# Patient Record
Sex: Female | Born: 1950
Health system: Southern US, Community
[De-identification: ages and names within clinical notes are randomized; demographics above are authoritative.]

## PROBLEM LIST (undated history)

## (undated) DIAGNOSIS — R7303 Prediabetes: Secondary | ICD-10-CM

## (undated) DIAGNOSIS — Z9289 Personal history of other medical treatment: Secondary | ICD-10-CM

## (undated) DIAGNOSIS — E039 Hypothyroidism, unspecified: Secondary | ICD-10-CM

## (undated) DIAGNOSIS — H9190 Unspecified hearing loss, unspecified ear: Secondary | ICD-10-CM

## (undated) DIAGNOSIS — I739 Peripheral vascular disease, unspecified: Secondary | ICD-10-CM

## (undated) DIAGNOSIS — E785 Hyperlipidemia, unspecified: Secondary | ICD-10-CM

## (undated) DIAGNOSIS — I10 Essential (primary) hypertension: Secondary | ICD-10-CM

## (undated) HISTORY — DX: Hyperlipidemia, unspecified: E78.5

## (undated) HISTORY — PX: CATARACT EXTRACTION W/ INTRAOCULAR LENS IMPLANT: SHX1309

## (undated) HISTORY — PX: TONSILLECTOMY: SUR1361

## (undated) HISTORY — DX: Hypothyroidism, unspecified: E03.9

## (undated) HISTORY — DX: Personal history of other medical treatment: Z92.89

## (undated) HISTORY — DX: Unspecified hearing loss, unspecified ear: H91.90

---

## 1999-12-09 ENCOUNTER — Emergency Department (HOSPITAL_COMMUNITY): Admission: EM | Admit: 1999-12-09 | Discharge: 1999-12-09 | Payer: Self-pay | Admitting: Emergency Medicine

## 2000-01-22 ENCOUNTER — Ambulatory Visit (HOSPITAL_COMMUNITY): Admission: RE | Admit: 2000-01-22 | Discharge: 2000-01-22 | Payer: Self-pay | Admitting: Family Medicine

## 2000-02-12 ENCOUNTER — Ambulatory Visit (HOSPITAL_COMMUNITY): Admission: RE | Admit: 2000-02-12 | Discharge: 2000-02-12 | Payer: Self-pay | Admitting: Family Medicine

## 2000-02-12 ENCOUNTER — Encounter: Payer: Self-pay | Admitting: Family Medicine

## 2000-03-29 ENCOUNTER — Ambulatory Visit (HOSPITAL_COMMUNITY): Admission: RE | Admit: 2000-03-29 | Discharge: 2000-03-29 | Payer: Self-pay | Admitting: Gynecology

## 2000-03-29 ENCOUNTER — Encounter: Payer: Self-pay | Admitting: Gynecology

## 2003-05-21 ENCOUNTER — Emergency Department (HOSPITAL_COMMUNITY): Admission: EM | Admit: 2003-05-21 | Discharge: 2003-05-22 | Payer: Self-pay | Admitting: Emergency Medicine

## 2007-12-15 ENCOUNTER — Ambulatory Visit: Payer: Self-pay | Admitting: Gynecology

## 2007-12-15 ENCOUNTER — Other Ambulatory Visit: Admission: RE | Admit: 2007-12-15 | Discharge: 2007-12-15 | Payer: Self-pay | Admitting: Gynecology

## 2007-12-15 ENCOUNTER — Encounter: Payer: Self-pay | Admitting: Gynecology

## 2015-03-16 ENCOUNTER — Encounter: Payer: Self-pay | Admitting: *Deleted

## 2015-03-23 ENCOUNTER — Encounter: Payer: Self-pay | Admitting: Cardiology

## 2015-03-23 ENCOUNTER — Ambulatory Visit (INDEPENDENT_AMBULATORY_CARE_PROVIDER_SITE_OTHER): Payer: PPO | Admitting: Cardiology

## 2015-03-23 VITALS — BP 160/100 | HR 91 | Ht 60.0 in | Wt 132.1 lb

## 2015-03-23 DIAGNOSIS — E785 Hyperlipidemia, unspecified: Secondary | ICD-10-CM

## 2015-03-23 DIAGNOSIS — R9431 Abnormal electrocardiogram [ECG] [EKG]: Secondary | ICD-10-CM

## 2015-03-23 NOTE — Progress Notes (Signed)
Cardiology Office Note    Date:  03/23/2015   ID:  Karina [name], DOB Jan 14, 1951, MRN [id number]  PCP:  Ethel Rana  Cardiologist:   Donato Schultz, MD     History of Present Illness:  Karina [name] is a 65 y.o. female here for evaluation of her heart. She had a screening EKG one point and was told she had an old anterior myocardial infarction. Prior blood pressure was 120/80 at 02/22/15 office visit personally reviewed. Her LDL was 167, HDL 70.  Her EKG today shows sinus rhythm with negative deflection in V1 and V2 with prompt upright V3 through V6. Sometimes this is indicative of an old septal infarct pattern. She is currently taking pravastatin 20 mg.  Quit smoking 12/16  No CP, mild SOB. Under stress - had palpitations.     Past Medical History  Diagnosis Date  . Hearing loss     Past Surgical History  Procedure Laterality Date  . Tonsillectomy      Outpatient Prescriptions Prior to Visit  Medication Sig Dispense Refill  . cholecalciferol (VITAMIN D) 1000 units tablet Take 1,000 Units by mouth daily.     No facility-administered medications prior to visit.     Allergies:   Review of patient's allergies indicates no known allergies.   Social History   Social History  . Marital Status: Divorced    Spouse Name: N/A  . Number of Children: N/A  . Years of Education: N/A   Social History Main Topics  . Smoking status: Current Every Day Smoker  . Smokeless tobacco: None  . Alcohol Use: No  . Drug Use: No  . Sexual Activity: Not Asked   Other Topics Concern  . None   Social History Narrative     Family History:  The patient's family history includes Diabetes in her mother; Hypertension in her mother; Stroke in her mother.   ROS:   Please see the history of present illness.    ROS joint pain All other systems reviewed and are negative.   PHYSICAL EXAM:   VS:  BP 160/100 mmHg  Pulse 91  Ht 5' (1.524 m)  Wt 132 lb 1.9 oz (59.929 kg)  BMI  25.80 kg/m2  SpO2 97%   GEN: Well nourished, well developed, in no acute distress HEENT: normal Neck: no JVD, carotid bruits, or masses Cardiac: RRR; no murmurs, rubs, or gallops,no edema  Respiratory:  clear to auscultation bilaterally, normal work of breathing GI: soft, nontender, nondistended, + BS MS: no deformity or atrophy Skin: warm and dry, no rash Neuro:  Alert and Oriented x 3, Strength and sensation are intact Psych: euthymic mood, full affect  Wt Readings from Last 3 Encounters:  03/23/15 132 lb 1.9 oz (59.929 kg)      Studies/Labs Reviewed:   EKG:  EKG is ordered today.  The ekg ordered today demonstrates 03/22/68-sinus rhythm, 88, nonspecific T-wave changes, QT prolongation, QTC 484 ms.poor immediate R-wave progression V1 and V2, possible septal infarct. Personally viewed.no significant change from prior EKG  Recent Labs: No results found for requested labs within last 365 days.   Lipid Panel No results found for: CHOL, TRIG, HDL, CHOLHDL, VLDL, LDLCALC, LDLDIRECT  Additional studies/ records that were reviewed today include:  Glucose 111 LDL as above Office notes reviewed    ASSESSMENT:    1. Abnormal EKG   2. Hyperlipemia      PLAN:  In order of problems listed above:  1. Abnormal EKG-possible old  septal infarct pattern. I will check an echocardiogram to ensure that she has normal structure and function. Otherwise, nonspecific ST-T wave changes noted. No chest pain, no significant shortness of breath although she had had shortness of breath since stopping smoking. 2. Hyperlipidemia-continue with pravastatin     Medication Adjustments/Labs and Tests Ordered: Current medicines are reviewed at length with the patient today.  Concerns regarding medicines are outlined above.  Medication changes, Labs and Tests ordered today are listed in the Patient Instructions below. Patient Instructions  Medication Instructions:  The current medical regimen is  effective;  continue present plan and medications.  Testing/Procedures: Your physician has requested that you have an echocardiogram. Echocardiography is a painless test that uses sound waves to create images of your heart. It provides your doctor with information about the size and shape of your heart and how well your heart's chambers and valves are working. This procedure takes approximately one hour. There are no restrictions for this procedure.  Follow-Up: Follow up as needed after testing.  If you need a refill on your cardiac medications before your next appointment, please call your pharmacy.  Thank you for choosing [facility]!!            Signed, Donato Schultz, MD  03/23/2015 2:45 PM    [facility] [address] [place], [place], [place]  [address] Phone: [phone]; Fax: [phone]

## 2015-03-23 NOTE — Patient Instructions (Signed)
Medication Instructions:  The current medical regimen is effective;  continue present plan and medications.  Testing/Procedures: Your physician has requested that you have an echocardiogram. Echocardiography is a painless test that uses sound waves to create images of your heart. It provides your doctor with information about the size and shape of your heart and how well your heart's chambers and valves are working. This procedure takes approximately one hour. There are no restrictions for this procedure.  Follow-Up: Follow up as needed after testing.  If you need a refill on your cardiac medications before your next appointment, please call your pharmacy.  Thank you for choosing [facility]!!     

## 2015-03-25 ENCOUNTER — Other Ambulatory Visit: Payer: Self-pay

## 2015-03-25 DIAGNOSIS — Z1231 Encounter for screening mammogram for malignant neoplasm of breast: Secondary | ICD-10-CM

## 2015-03-25 DIAGNOSIS — E785 Hyperlipidemia, unspecified: Secondary | ICD-10-CM | POA: Diagnosis not present

## 2015-03-25 DIAGNOSIS — E069 Thyroiditis, unspecified: Secondary | ICD-10-CM | POA: Diagnosis not present

## 2015-04-04 ENCOUNTER — Ambulatory Visit (HOSPITAL_COMMUNITY): Payer: PPO | Attending: Internal Medicine

## 2015-04-04 ENCOUNTER — Telehealth (HOSPITAL_COMMUNITY): Payer: Self-pay | Admitting: *Deleted

## 2015-04-04 ENCOUNTER — Other Ambulatory Visit (HOSPITAL_COMMUNITY): Payer: PPO | Admitting: *Deleted

## 2015-04-04 ENCOUNTER — Other Ambulatory Visit: Payer: Self-pay

## 2015-04-04 DIAGNOSIS — Z87891 Personal history of nicotine dependence: Secondary | ICD-10-CM | POA: Insufficient documentation

## 2015-04-04 DIAGNOSIS — I5189 Other ill-defined heart diseases: Secondary | ICD-10-CM | POA: Diagnosis not present

## 2015-04-04 DIAGNOSIS — R9431 Abnormal electrocardiogram [ECG] [EKG]: Secondary | ICD-10-CM | POA: Insufficient documentation

## 2015-04-04 DIAGNOSIS — I071 Rheumatic tricuspid insufficiency: Secondary | ICD-10-CM | POA: Insufficient documentation

## 2015-04-04 MED ORDER — PERFLUTREN LIPID MICROSPHERE
1.0000 mL | INTRAVENOUS | Status: AC | PRN
  Administered 2015-04-04: 1 mL via INTRAVENOUS

## 2015-04-04 NOTE — Telephone Encounter (Signed)
Dr. Salome Holmes office notified of patient extreme high BP. Spoke with Sheri,nurse for Dr. Salome Holmes. She will notify the Dr.and get back with [place]. I have not discharged patient until I hear back from their office.

## 2015-04-04 NOTE — Telephone Encounter (Signed)
Sheri from Dr. Salome Holmes office called back. Dr. Salome Holmes wants [place] to discharge patient with no orders given. Sheri stated patient can stop by office tomorrow for recheck BP.

## 2015-04-05 ENCOUNTER — Ambulatory Visit
Admission: RE | Admit: 2015-04-05 | Discharge: 2015-04-05 | Disposition: A | Discharge status: Home / Self Care | Payer: PPO | Source: Ambulatory Visit

## 2015-04-05 DIAGNOSIS — Z1231 Encounter for screening mammogram for malignant neoplasm of breast: Secondary | ICD-10-CM | POA: Diagnosis not present

## 2015-05-10 DIAGNOSIS — I10 Essential (primary) hypertension: Secondary | ICD-10-CM | POA: Diagnosis not present

## 2015-05-30 DIAGNOSIS — E039 Hypothyroidism, unspecified: Secondary | ICD-10-CM | POA: Diagnosis not present

## 2015-05-30 DIAGNOSIS — I10 Essential (primary) hypertension: Secondary | ICD-10-CM | POA: Diagnosis not present

## 2015-06-18 ENCOUNTER — Encounter (HOSPITAL_COMMUNITY): Payer: Self-pay | Admitting: Emergency Medicine

## 2015-06-18 ENCOUNTER — Inpatient Hospital Stay (HOSPITAL_COMMUNITY)
Admission: EM | Admit: 2015-06-18 | Discharge: 2015-06-20 | DRG: 305 | Disposition: A | Discharge status: Home / Self Care | Payer: PPO | Attending: Internal Medicine | Admitting: Internal Medicine

## 2015-06-18 DIAGNOSIS — F17200 Nicotine dependence, unspecified, uncomplicated: Secondary | ICD-10-CM | POA: Diagnosis not present

## 2015-06-18 DIAGNOSIS — E039 Hypothyroidism, unspecified: Secondary | ICD-10-CM | POA: Diagnosis present

## 2015-06-18 DIAGNOSIS — H9190 Unspecified hearing loss, unspecified ear: Secondary | ICD-10-CM | POA: Diagnosis present

## 2015-06-18 DIAGNOSIS — Z823 Family history of stroke: Secondary | ICD-10-CM | POA: Diagnosis not present

## 2015-06-18 DIAGNOSIS — R778 Other specified abnormalities of plasma proteins: Secondary | ICD-10-CM | POA: Diagnosis present

## 2015-06-18 DIAGNOSIS — R0789 Other chest pain: Secondary | ICD-10-CM | POA: Diagnosis not present

## 2015-06-18 DIAGNOSIS — I10 Essential (primary) hypertension: Secondary | ICD-10-CM | POA: Diagnosis present

## 2015-06-18 DIAGNOSIS — R748 Abnormal levels of other serum enzymes: Secondary | ICD-10-CM | POA: Diagnosis present

## 2015-06-18 DIAGNOSIS — Z79899 Other long term (current) drug therapy: Secondary | ICD-10-CM

## 2015-06-18 DIAGNOSIS — I160 Hypertensive urgency: Secondary | ICD-10-CM | POA: Diagnosis not present

## 2015-06-18 DIAGNOSIS — F419 Anxiety disorder, unspecified: Secondary | ICD-10-CM | POA: Diagnosis not present

## 2015-06-18 DIAGNOSIS — Z833 Family history of diabetes mellitus: Secondary | ICD-10-CM | POA: Diagnosis not present

## 2015-06-18 DIAGNOSIS — R7989 Other specified abnormal findings of blood chemistry: Secondary | ICD-10-CM | POA: Diagnosis not present

## 2015-06-18 DIAGNOSIS — R079 Chest pain, unspecified: Secondary | ICD-10-CM

## 2015-06-18 DIAGNOSIS — E079 Disorder of thyroid, unspecified: Secondary | ICD-10-CM | POA: Diagnosis present

## 2015-06-18 DIAGNOSIS — I161 Hypertensive emergency: Principal | ICD-10-CM | POA: Diagnosis present

## 2015-06-18 DIAGNOSIS — Z8249 Family history of ischemic heart disease and other diseases of the circulatory system: Secondary | ICD-10-CM

## 2015-06-18 LAB — BASIC METABOLIC PANEL
Anion gap: 11 (ref 5–15)
BUN: 14 mg/dL (ref 6–20)
CALCIUM: 9.3 mg/dL (ref 8.9–10.3)
CO2: 22 mmol/L (ref 22–32)
Chloride: 99 mmol/L — ABNORMAL LOW (ref 101–111)
Creatinine, Ser: 0.78 mg/dL (ref 0.44–1.00)
GFR calc Af Amer: >60 mL/min (ref >60)
GLUCOSE: 117 mg/dL — AB (ref 65–99)
Potassium: 3.7 mmol/L (ref 3.5–5.1)
Sodium: 132 mmol/L — ABNORMAL LOW (ref 135–145)

## 2015-06-18 LAB — CBC WITH DIFFERENTIAL/PLATELET
Basophils Absolute: 0 10*3/uL (ref 0.0–0.1)
Basophils Relative: 0 %
EOS PCT: 0 %
Eosinophils Absolute: 0 10*3/uL (ref 0.0–0.7)
HCT: 36.8 % (ref 36.0–46.0)
Hemoglobin: 12.7 g/dL (ref 12.0–15.0)
LYMPHS ABS: 2.2 10*3/uL (ref 0.7–4.0)
LYMPHS PCT: 37 %
MCH: 30.4 pg (ref 26.0–34.0)
MCHC: 34.5 g/dL (ref 30.0–36.0)
MCV: 88 fL (ref 78.0–100.0)
MONO ABS: 0.4 10*3/uL (ref 0.1–1.0)
Monocytes Relative: 6 %
Neutro Abs: 3.4 10*3/uL (ref 1.7–7.7)
Neutrophils Relative %: 57 %
PLATELETS: 252 10*3/uL (ref 150–400)
RBC: 4.18 MIL/uL (ref 3.87–5.11)
RDW: 12.8 % (ref 11.5–15.5)
WBC: 6.1 10*3/uL (ref 4.0–10.5)

## 2015-06-18 LAB — TROPONIN I: Troponin I: 0.04 ng/mL — ABNORMAL HIGH (ref <0.031)

## 2015-06-18 NOTE — ED Notes (Signed)
Pt from home with complaints of hypertension. Pt has records of taking her blood pressure over 20 times every day since January. Pt states sometimes she has depression related to her thyroid problems. Pt is alert and oriented.

## 2015-06-18 NOTE — ED Notes (Signed)
MD at bedside. 

## 2015-06-19 ENCOUNTER — Inpatient Hospital Stay (HOSPITAL_COMMUNITY): Payer: PPO

## 2015-06-19 DIAGNOSIS — F17200 Nicotine dependence, unspecified, uncomplicated: Secondary | ICD-10-CM | POA: Diagnosis not present

## 2015-06-19 DIAGNOSIS — R748 Abnormal levels of other serum enzymes: Secondary | ICD-10-CM | POA: Diagnosis not present

## 2015-06-19 DIAGNOSIS — Z79899 Other long term (current) drug therapy: Secondary | ICD-10-CM | POA: Diagnosis not present

## 2015-06-19 DIAGNOSIS — E039 Hypothyroidism, unspecified: Secondary | ICD-10-CM | POA: Diagnosis not present

## 2015-06-19 DIAGNOSIS — I161 Hypertensive emergency: Secondary | ICD-10-CM | POA: Diagnosis not present

## 2015-06-19 DIAGNOSIS — F419 Anxiety disorder, unspecified: Secondary | ICD-10-CM | POA: Diagnosis not present

## 2015-06-19 DIAGNOSIS — I160 Hypertensive urgency: Secondary | ICD-10-CM | POA: Diagnosis not present

## 2015-06-19 DIAGNOSIS — I10 Essential (primary) hypertension: Secondary | ICD-10-CM

## 2015-06-19 DIAGNOSIS — R7989 Other specified abnormal findings of blood chemistry: Secondary | ICD-10-CM

## 2015-06-19 DIAGNOSIS — H9190 Unspecified hearing loss, unspecified ear: Secondary | ICD-10-CM | POA: Diagnosis not present

## 2015-06-19 DIAGNOSIS — R778 Other specified abnormalities of plasma proteins: Secondary | ICD-10-CM | POA: Diagnosis present

## 2015-06-19 DIAGNOSIS — E079 Disorder of thyroid, unspecified: Secondary | ICD-10-CM | POA: Diagnosis present

## 2015-06-19 DIAGNOSIS — R079 Chest pain, unspecified: Secondary | ICD-10-CM | POA: Diagnosis not present

## 2015-06-19 DIAGNOSIS — R0789 Other chest pain: Secondary | ICD-10-CM | POA: Insufficient documentation

## 2015-06-19 DIAGNOSIS — Z823 Family history of stroke: Secondary | ICD-10-CM | POA: Diagnosis not present

## 2015-06-19 DIAGNOSIS — Z8249 Family history of ischemic heart disease and other diseases of the circulatory system: Secondary | ICD-10-CM | POA: Diagnosis not present

## 2015-06-19 DIAGNOSIS — Z833 Family history of diabetes mellitus: Secondary | ICD-10-CM | POA: Diagnosis not present

## 2015-06-19 LAB — CBC
HCT: 34.3 % — ABNORMAL LOW (ref 36.0–46.0)
Hemoglobin: 11.8 g/dL — ABNORMAL LOW (ref 12.0–15.0)
MCH: 30.1 pg (ref 26.0–34.0)
MCHC: 34.4 g/dL (ref 30.0–36.0)
MCV: 87.5 fL (ref 78.0–100.0)
PLATELETS: 253 10*3/uL (ref 150–400)
RBC: 3.92 MIL/uL (ref 3.87–5.11)
RDW: 12.9 % (ref 11.5–15.5)
WBC: 4.9 10*3/uL (ref 4.0–10.5)

## 2015-06-19 LAB — BASIC METABOLIC PANEL
Anion gap: 10 (ref 5–15)
BUN: 12 mg/dL (ref 6–20)
CHLORIDE: 97 mmol/L — AB (ref 101–111)
CO2: 25 mmol/L (ref 22–32)
CREATININE: 0.77 mg/dL (ref 0.44–1.00)
Calcium: 9.3 mg/dL (ref 8.9–10.3)
Glucose, Bld: 97 mg/dL (ref 65–99)
POTASSIUM: 3.4 mmol/L — AB (ref 3.5–5.1)
SODIUM: 132 mmol/L — AB (ref 135–145)

## 2015-06-19 LAB — TROPONIN I
TROPONIN I: 0.04 ng/mL — AB (ref <0.031)
TROPONIN I: 0.03 ng/mL (ref <0.031)
TROPONIN I: 0.03 ng/mL (ref <0.031)

## 2015-06-19 LAB — TSH: TSH: 2.982 u[IU]/mL (ref 0.350–4.500)

## 2015-06-19 MED ORDER — SODIUM CHLORIDE 0.9% FLUSH: 3.0000 mL | INTRAVENOUS | Status: DC | PRN

## 2015-06-19 MED ORDER — CALCIUM CARBONATE 1250 (500 CA) MG PO TABS
1250.0000 mg | ORAL_TABLET | Freq: Every day | ORAL | Status: DC
Start: 2015-06-19 — End: 2015-06-20
  Administered 2015-06-19 – 2015-06-20 (×2): 1250 mg via ORAL
  Filled 2015-06-19 (×2): qty 1

## 2015-06-19 MED ORDER — TRIAMTERENE-HCTZ 37.5-25 MG PO TABS
1.0000 | ORAL_TABLET | Freq: Every day | ORAL | Status: DC
  Administered 2015-06-19 – 2015-06-20 (×2): 1 via ORAL
  Filled 2015-06-19 (×2): qty 1

## 2015-06-19 MED ORDER — ENOXAPARIN SODIUM 40 MG/0.4ML ~~LOC~~ SOLN
40.0000 mg | SUBCUTANEOUS | Status: DC
  Administered 2015-06-19 – 2015-06-20 (×2): 40 mg via SUBCUTANEOUS
  Filled 2015-06-19 (×2): qty 0.4

## 2015-06-19 MED ORDER — AMLODIPINE BESYLATE 5 MG PO TABS
5.0000 mg | ORAL_TABLET | Freq: Two times a day (BID) | ORAL | Status: DC
  Administered 2015-06-19 – 2015-06-20 (×2): 5 mg via ORAL
  Filled 2015-06-19 (×2): qty 1

## 2015-06-19 MED ORDER — HYDRALAZINE HCL 20 MG/ML IJ SOLN: 10.0000 mg | Freq: Four times a day (QID) | INTRAMUSCULAR | Status: DC | PRN

## 2015-06-19 MED ORDER — SODIUM CHLORIDE 0.9 % IV SOLN: 250.0000 mL | INTRAVENOUS | Status: DC | PRN

## 2015-06-19 MED ORDER — OXYCODONE HCL 5 MG PO TABS: 5.0000 mg | ORAL_TABLET | ORAL | Status: DC | PRN

## 2015-06-19 MED ORDER — LORAZEPAM 2 MG/ML IJ SOLN
0.5000 mg | Freq: Once | INTRAMUSCULAR | Status: AC
  Administered 2015-06-19: 0.5 mg via INTRAVENOUS
  Filled 2015-06-19: qty 1

## 2015-06-19 MED ORDER — NITROGLYCERIN 2 % TD OINT
1.0000 [in_us] | TOPICAL_OINTMENT | Freq: Three times a day (TID) | TRANSDERMAL | Status: DC
Start: 2015-06-19 — End: 2015-06-19

## 2015-06-19 MED ORDER — ACETAMINOPHEN 325 MG PO TABS: 650.0000 mg | ORAL_TABLET | Freq: Four times a day (QID) | ORAL | Status: DC | PRN

## 2015-06-19 MED ORDER — SODIUM CHLORIDE 0.9% FLUSH
3.0000 mL | Freq: Two times a day (BID) | INTRAVENOUS | Status: DC
  Administered 2015-06-19 – 2015-06-20 (×2): 3 mL via INTRAVENOUS

## 2015-06-19 MED ORDER — ENSURE ENLIVE PO LIQD
237.0000 mL | Freq: Two times a day (BID) | ORAL | Status: DC
  Administered 2015-06-19 (×2): 237 mL via ORAL

## 2015-06-19 MED ORDER — HYDROCHLOROTHIAZIDE 12.5 MG PO CAPS: 25.0000 mg | ORAL_CAPSULE | Freq: Every day | ORAL | Status: DC

## 2015-06-19 MED ORDER — ZOLPIDEM TARTRATE 5 MG PO TABS
5.0000 mg | ORAL_TABLET | Freq: Once | ORAL | Status: AC
  Administered 2015-06-19: 5 mg via ORAL
  Filled 2015-06-19: qty 1

## 2015-06-19 MED ORDER — ACETAMINOPHEN 650 MG RE SUPP: 650.0000 mg | Freq: Four times a day (QID) | RECTAL | Status: DC | PRN

## 2015-06-19 MED ORDER — HYDROCHLOROTHIAZIDE 25 MG PO TABS: 25.0000 mg | ORAL_TABLET | Freq: Every day | ORAL | Status: DC

## 2015-06-19 MED ORDER — LEVOTHYROXINE SODIUM 50 MCG PO TABS
75.0000 ug | ORAL_TABLET | Freq: Every day | ORAL | Status: DC
  Administered 2015-06-19 – 2015-06-20 (×2): 75 ug via ORAL
  Filled 2015-06-19 (×2): qty 1

## 2015-06-19 MED ORDER — POTASSIUM CHLORIDE CRYS ER 20 MEQ PO TBCR
40.0000 meq | EXTENDED_RELEASE_TABLET | Freq: Two times a day (BID) | ORAL | Status: AC
  Administered 2015-06-19 (×2): 40 meq via ORAL
  Filled 2015-06-19 (×2): qty 2

## 2015-06-19 MED ORDER — SODIUM CHLORIDE 0.9% FLUSH
3.0000 mL | Freq: Two times a day (BID) | INTRAVENOUS | Status: DC
  Administered 2015-06-19 (×2): 3 mL via INTRAVENOUS

## 2015-06-19 MED ORDER — NITROGLYCERIN 2 % TD OINT
1.0000 [in_us] | TOPICAL_OINTMENT | Freq: Four times a day (QID) | TRANSDERMAL | Status: DC
  Administered 2015-06-19 (×2): 1 [in_us] via TOPICAL
  Filled 2015-06-19: qty 30
  Filled 2015-06-19: qty 1

## 2015-06-19 MED ORDER — ONDANSETRON HCL 4 MG PO TABS: 4.0000 mg | ORAL_TABLET | Freq: Four times a day (QID) | ORAL | Status: DC | PRN

## 2015-06-19 MED ORDER — HYDROCHLOROTHIAZIDE 12.5 MG PO CAPS
12.5000 mg | ORAL_CAPSULE | Freq: Once | ORAL | Status: AC
  Administered 2015-06-19: 12.5 mg via ORAL
  Filled 2015-06-19: qty 1

## 2015-06-19 MED ORDER — HYDROCHLOROTHIAZIDE 12.5 MG PO CAPS
12.5000 mg | ORAL_CAPSULE | Freq: Every day | ORAL | Status: DC
  Administered 2015-06-19: 12.5 mg via ORAL
  Filled 2015-06-19: qty 1

## 2015-06-19 MED ORDER — PRAVASTATIN SODIUM 20 MG PO TABS
20.0000 mg | ORAL_TABLET | Freq: Every day | ORAL | Status: DC
  Administered 2015-06-19 – 2015-06-20 (×2): 20 mg via ORAL
  Filled 2015-06-19 (×2): qty 1

## 2015-06-19 MED ORDER — PANTOPRAZOLE SODIUM 40 MG PO TBEC
40.0000 mg | DELAYED_RELEASE_TABLET | Freq: Every day | ORAL | Status: DC
  Administered 2015-06-19 – 2015-06-20 (×2): 40 mg via ORAL
  Filled 2015-06-19 (×3): qty 1

## 2015-06-19 MED ORDER — AMLODIPINE BESYLATE 5 MG PO TABS
5.0000 mg | ORAL_TABLET | Freq: Every day | ORAL | Status: DC
  Administered 2015-06-19: 5 mg via ORAL
  Filled 2015-06-19: qty 1

## 2015-06-19 MED ORDER — ASPIRIN 325 MG PO TABS
325.0000 mg | ORAL_TABLET | Freq: Every day | ORAL | Status: DC
  Administered 2015-06-19 – 2015-06-20 (×2): 325 mg via ORAL
  Filled 2015-06-19 (×3): qty 1

## 2015-06-19 MED ORDER — ONDANSETRON HCL 4 MG/2ML IJ SOLN: 4.0000 mg | Freq: Four times a day (QID) | INTRAMUSCULAR | Status: DC | PRN

## 2015-06-19 MED ORDER — HYDROMORPHONE HCL 1 MG/ML IJ SOLN: 0.5000 mg | INTRAMUSCULAR | Status: DC | PRN

## 2015-06-19 MED ORDER — LISINOPRIL 10 MG PO TABS
10.0000 mg | ORAL_TABLET | Freq: Once | ORAL | Status: AC
  Administered 2015-06-19: 10 mg via ORAL
  Filled 2015-06-19: qty 1

## 2015-06-19 NOTE — Progress Notes (Signed)
[facility] PROGRESS NOTE  Karina MORNEAULT KGM:[id number] DOB: May 09, 1950 DOA: 06/18/2015 PCP: Beatris Si  Assessment/Plan: 1-HTN emergency; Patient presents with chest discomfort, elevated BP.  Will increase HCTZ,, repeat B-met in am to follow sodium level. Added KCL. Discontinue nitropaste... Added Norvasc.  PRN hydralazine.   2-Chest discomfort, Patient report " bad feeling, something wrong in her chest.  Check chest x ray.  Mildly elevated troponin might be related to HTN  Cardiology consulted.   3-Hypothyroid;  Continue with synthroid.  Repeated TSH; normal.   Code Status: full code.  Family Communication: care discussed with daughter and son who were at bedside.  Disposition Plan: home when BP better controlled.    Consultants:  Cardiology   Procedures:  none  Antibiotics:  none  HPI/Subjective: Lisinopril was stop because she was having problems with tasting food.  She report bad feeling, chest pressure ? , something is wrong with her chest   Objective: Filed Vitals:   06/19/15 0300 06/19/15 0350  BP: 179/67 175/57  Pulse: 71 62  Temp:  98.7 F (37.1 C)  Resp: 16 16    Intake/Output Summary (Last 24 hours) at 06/19/15 1109 Last data filed at 06/19/15 0900  Gross per 24 hour  Intake     60 ml  Output      0 ml  Net     60 ml   Filed Weights   06/18/15 2017 06/19/15 0350  Weight: 58.06 kg (128 lb) 53.4 kg (117 lb 11.6 oz)    Exam:   General:  Alert in no acute distress  Cardiovascular: S 1, S 2 RRR  Respiratory: CTA  Abdomen: BS present, soft, nt  Musculoskeletal: no edema  Data Reviewed: Basic Metabolic Panel:  Recent Labs Lab 06/18/15 2303 06/19/15 0525  NA 132* 132*  K 3.7 3.4*  CL 99* 97*  CO2 22 25  GLUCOSE 117* 97  BUN 14 12  CREATININE 0.78 0.77  CALCIUM 9.3 9.3   Liver Function Tests: No results for input(s): AST, ALT, ALKPHOS, BILITOT, PROT, ALBUMIN in the last 168 hours. No results for input(s):  LIPASE, AMYLASE in the last 168 hours. No results for input(s): AMMONIA in the last 168 hours. CBC:  Recent Labs Lab 06/18/15 2303 06/19/15 0525  WBC 6.1 4.9  NEUTROABS 3.4  --   HGB 12.7 11.8*  HCT 36.8 34.3*  MCV 88.0 87.5  PLT 252 253   Cardiac Enzymes:  Recent Labs Lab 06/18/15 2303 06/19/15 0525  TROPONINI 0.04* 0.04*   BNP (last 3 results) No results for input(s): BNP in the last 8760 hours.  ProBNP (last 3 results) No results for input(s): PROBNP in the last 8760 hours.  CBG: No results for input(s): GLUCAP in the last 168 hours.  No results found for this or any previous visit (from the past 240 hour(s)).   Studies: No results found.  Scheduled Meds: . aspirin  325 mg Oral Daily  . calcium carbonate  1,250 mg Oral Daily  . enoxaparin (LOVENOX) injection  40 mg Subcutaneous Q24H  . feeding supplement (ENSURE ENLIVE)  237 mL Oral BID BM  . [START ON 06/20/2015] hydrochlorothiazide  25 mg Oral Daily  . hydrochlorothiazide  12.5 mg Oral Once  . levothyroxine  75 mcg Oral QAC breakfast  . nitroGLYCERIN  1 inch Topical 3 times per day  . pantoprazole  40 mg Oral Daily  . potassium chloride  40 mEq Oral BID  . pravastatin  20 mg Oral q1800  .  sodium chloride flush  3 mL Intravenous Q12H  . sodium chloride flush  3 mL Intravenous Q12H   Continuous Infusions:   Principal Problem:   Hypertensive urgency Active Problems:   Thyroid disease   Elevated troponin   Anxiety   Chest pain    Time spent: 35 minutes.     Niel Hummer A  [facility] Pager [phone]. If 7PM-7AM, please contact night-coverage at [email or web address], password [email or web address] 06/19/2015, 11:09 AM  LOS: 0 days

## 2015-06-19 NOTE — Consult Note (Signed)
Primary Physician: Primary Cardiologist:  New    Asked to see re CP and elevated BP  HPI: Pt is a 65 yow with hsitory of HTN, hypothyroidism.  Admitted yesratdy with increased BP  Pt says like that since meds changed   Patient says taht she feels like "scared  ' semsatopm in chest   Feeling  Bad Takes BP at home        Past Medical History  Diagnosis Date  . Hearing loss   . Thyroid disease     "thyroid problem" unable to explain    Medications Prior to Admission  Medication Sig Dispense Refill  . calcium carbonate (OSCAL) 1500 (600 Ca) MG TABS tablet Take 600 mg of elemental calcium by mouth daily.    . hydrochlorothiazide (HYDRODIURIL) 12.5 MG tablet Take 12.5 mg by mouth daily.  2  . levothyroxine (SYNTHROID, LEVOTHROID) 75 MCG tablet Take 75 mcg by mouth daily.  0  . pravastatin (PRAVACHOL) 20 MG tablet Take 20 mg by mouth daily. Reported on 06/18/2015  1     . amLODipine  5 mg Oral Daily  . aspirin  325 mg Oral Daily  . calcium carbonate  1,250 mg Oral Daily  . enoxaparin (LOVENOX) injection  40 mg Subcutaneous Q24H  . feeding supplement (ENSURE ENLIVE)  237 mL Oral BID BM  . [START ON 06/20/2015] hydrochlorothiazide  25 mg Oral Daily  . levothyroxine  75 mcg Oral QAC breakfast  . pantoprazole  40 mg Oral Daily  . potassium chloride  40 mEq Oral BID  . pravastatin  20 mg Oral q1800  . sodium chloride flush  3 mL Intravenous Q12H  . sodium chloride flush  3 mL Intravenous Q12H    Infusions:    No Known Allergies  Social History   Social History  . Marital Status: Divorced    Spouse Name: N/A  . Number of Children: N/A  . Years of Education: N/A   Occupational History  . Not on file.   Social History Main Topics  . Smoking status: Current Every Day Smoker  . Smokeless tobacco: Not on file  . Alcohol Use: No  . Drug Use: No  . Sexual Activity: Not on file   Other Topics Concern  . Not on file   Social History Narrative    Family History    Problem Relation Age of Onset  . Stroke Mother   . Diabetes Mother   . Hypertension Mother     REVIEW OF SYSTEMS:  All systems reviewed  Negative to the above problem except as noted above.    PHYSICAL EXAM: Filed Vitals:   06/19/15 0350 06/19/15 1125  BP: 175/57 172/63  Pulse: 62   Temp: 98.7 F (37.1 C)   Resp: 16      Intake/Output Summary (Last 24 hours) at 06/19/15 1334 Last data filed at 06/19/15 0900  Gross per 24 hour  Intake     60 ml  Output      0 ml  Net     60 ml    General:  Well appearing. No respiratory difficulty HEENT: normal Neck: supple. no JVD. Carotids 2+ bilat; no bruits. No lymphadenopathy or thryomegaly appreciated. Cor: PMI nondisplaced. Regular rate & rhythm. No rubs, gallops or murmurs. Lungs: clear Abdomen: soft, nontender, nondistended. No hepatosplenomegaly. No bruits or masses. Good bowel sounds. Extremities: no cyanosis, clubbing, rash, edema Neuro: alert & oriented x 3, cranial nerves grossly intact. moves all 4 extremities  w/o difficulty. Affect pleasant.  ECG:  SR 83 bpm  Sl ST depression in the inferior and lateral leads.    Results for orders placed or performed during the hospital encounter of 06/18/15 (from the past 24 hour(s))  CBC with Differential/Platelet     Status: None   Collection Time: 06/18/15 11:03 PM  Result Value Ref Range   WBC 6.1 4.0 - 10.5 K/uL   RBC 4.18 3.87 - 5.11 MIL/uL   Hemoglobin 12.7 12.0 - 15.0 g/dL   HCT 53.6 64.4 - 03.4 %   MCV 88.0 78.0 - 100.0 fL   MCH 30.4 26.0 - 34.0 pg   MCHC 34.5 30.0 - 36.0 g/dL   RDW 74.2 59.5 - 63.8 %   Platelets 252 150 - 400 K/uL   Neutrophils Relative % 57 %   Neutro Abs 3.4 1.7 - 7.7 K/uL   Lymphocytes Relative 37 %   Lymphs Abs 2.2 0.7 - 4.0 K/uL   Monocytes Relative 6 %   Monocytes Absolute 0.4 0.1 - 1.0 K/uL   Eosinophils Relative 0 %   Eosinophils Absolute 0.0 0.0 - 0.7 K/uL   Basophils Relative 0 %   Basophils Absolute 0.0 0.0 - 0.1 K/uL  Basic  metabolic panel     Status: Abnormal   Collection Time: 06/18/15 11:03 PM  Result Value Ref Range   Sodium 132 (L) 135 - 145 mmol/L   Potassium 3.7 3.5 - 5.1 mmol/L   Chloride 99 (L) 101 - 111 mmol/L   CO2 22 22 - 32 mmol/L   Glucose, Bld 117 (H) 65 - 99 mg/dL   BUN 14 6 - 20 mg/dL   Creatinine, Ser 7.56 0.44 - 1.00 mg/dL   Calcium 9.3 8.9 - 43.3 mg/dL   GFR calc non Af Amer >60 >60 mL/min   GFR calc Af Amer >60 >60 mL/min   Anion gap 11 5 - 15  Troponin I     Status: Abnormal   Collection Time: 06/18/15 11:03 PM  Result Value Ref Range   Troponin I 0.04 (H) <0.031 ng/mL  Basic metabolic panel     Status: Abnormal   Collection Time: 06/19/15  5:25 AM  Result Value Ref Range   Sodium 132 (L) 135 - 145 mmol/L   Potassium 3.4 (L) 3.5 - 5.1 mmol/L   Chloride 97 (L) 101 - 111 mmol/L   CO2 25 22 - 32 mmol/L   Glucose, Bld 97 65 - 99 mg/dL   BUN 12 6 - 20 mg/dL   Creatinine, Ser 2.95 0.44 - 1.00 mg/dL   Calcium 9.3 8.9 - 18.8 mg/dL   GFR calc non Af Amer >60 >60 mL/min   GFR calc Af Amer >60 >60 mL/min   Anion gap 10 5 - 15  CBC     Status: Abnormal   Collection Time: 06/19/15  5:25 AM  Result Value Ref Range   WBC 4.9 4.0 - 10.5 K/uL   RBC 3.92 3.87 - 5.11 MIL/uL   Hemoglobin 11.8 (L) 12.0 - 15.0 g/dL   HCT 41.6 (L) 60.6 - 30.1 %   MCV 87.5 78.0 - 100.0 fL   MCH 30.1 26.0 - 34.0 pg   MCHC 34.4 30.0 - 36.0 g/dL   RDW 60.1 09.3 - 23.5 %   Platelets 253 150 - 400 K/uL  TSH     Status: None   Collection Time: 06/19/15  5:25 AM  Result Value Ref Range   TSH 2.982 0.350 - 4.500  uIU/mL  Troponin I     Status: Abnormal   Collection Time: 06/19/15  5:25 AM  Result Value Ref Range   Troponin I 0.04 (H) <0.031 ng/mL  Troponin I     Status: None   Collection Time: 06/19/15 11:53 AM  Result Value Ref Range   Troponin I 0.03 <0.031 ng/mL   Dg Chest 2 View  06/19/2015  CLINICAL DATA:  65 year old female with high blood pressure. EXAM: CHEST  2 VIEW COMPARISON:  No priors.  FINDINGS: Lung volumes are normal. No consolidative airspace disease. No pleural effusions. No pneumothorax. No pulmonary nodule or mass noted. Pulmonary vasculature and the cardiomediastinal silhouette are within normal limits. Atherosclerosis in the thoracic aorta. IMPRESSION: 1.  No radiographic evidence of acute cardiopulmonary disease. 2. Atherosclerosis. Electronically Signed   By: Trudie Reedaniel  Entrikin M.D.   On: 06/19/2015 11:54     ASSESSMENT: Pt is a 65 yo with history of HTN  Admitted with yesterday  BP severely elevated at time and pt had " funny feeling in chest"  Nervous?   Since admit BP has improvedsome  Feeling in chest is gone PT and daughter say that her BP is high at home She has attrib to being nervous   Was on lisinopril but stopped because ? Loss of taste  OVerall, history is a little confusing   On exam, BP is still not well controlled  Labs signfi for trivial elevation of troponin Probably due to BP elevation CXR shows some calcifications of the aorta  Recomm  1  Given chest sensations and presence of vascular dz on Xray I would recomm lexiscan myovue to r/o ischemia  2  HTN  BP still not controlled  I would increase amlodipine to 5 bid Watch KCL now that on 25 of hydrodiuril  Would recomm switching to Maxzide On lisinopril  ? Taste issue.  Consider switch to Cozaar  3  HL  Continue statin

## 2015-06-19 NOTE — H&P (Signed)
[facility] Admission History and Physical       Karina [name] AVW:[id number] DOB: 06/17/1950 DOA: 06/18/2015  Referring physician: EDP PCP: Ethel Rana  Specialists:   Chief Complaint: Elevated Blood Pressures  HPI: Karina [name] is a 65 y.o. female with a history of HTN, Hypothyroid who presents to the ED with complaints of elevated blood pressures at home worse the past day.    She reports her blood pressures have been elevated since her blood pressures medications had been changed and her Lisinopril had been discontinued in March.   She has been hypervigilant with checking of her blood pressures She reports having headaches, and also reports that her chest does not feel good.   She was administered Lisinopril  PO x 1 in the ED, and was evaluated and was found to have a positive Troponin level at 0.04 and was referred for observation.     Review of Systems:    Constitutional: No Weight Loss, No Weight Gain, Night Sweats, Fevers, Chills, Dizziness, Light Headedness, Fatigue, or Generalized Weakness HEENT:  +Headaches, Difficulty Swallowing,Tooth/Dental Problems,Sore Throat,  No Sneezing, Rhinitis, Ear Ache, Nasal Congestion, or Post Nasal Drip,  Cardio-vascular:  +Chest pain, Orthopnea, PND, Edema in Lower Extremities, Anasarca, Dizziness, Palpitations  Resp: No Dyspnea, No DOE, No Productive Cough, No Non-Productive Cough, No Hemoptysis, No Wheezing.    GI: No Heartburn, Indigestion, Abdominal Pain, Nausea, Vomiting, Diarrhea, Constipation, Hematemesis, Hematochezia, Melena, Change in Bowel Habits,  Loss of Appetite  GU: No Dysuria, No Change in Color of Urine, No Urgency or Urinary Frequency, No Flank pain.  Musculoskeletal: No Joint Pain or Swelling, No Decreased Range of Motion, No Back Pain.  Neurologic: No Syncope, No Seizures, Muscle Weakness, Paresthesia, Vision Disturbance or Loss, No Diplopia, No Vertigo, No Difficulty Walking,  Skin: No Rash or Lesions. Psych:  No Change in Mood or Affect, No Depression or Anxiety, No Memory loss, No Confusion, or Hallucinations   Past Medical History  Diagnosis Date  . Hearing loss   . Thyroid disease     "thyroid problem" unable to explain     Past Surgical History  Procedure Laterality Date  . Tonsillectomy        Prior to Admission medications   Medication Sig Start Date End Date Taking? Authorizing Provider  calcium carbonate (OSCAL) 1500 (600 Ca) MG TABS tablet Take 600 mg of elemental calcium by mouth daily.   Yes Historical Provider, MD  hydrochlorothiazide (HYDRODIURIL) 12.5 MG tablet Take 12.5 mg by mouth daily. 06/01/15  Yes Historical Provider, MD  levothyroxine (SYNTHROID, LEVOTHROID) 75 MCG tablet Take 75 mcg by mouth daily. 06/01/15  Yes Historical Provider, MD  pravastatin (PRAVACHOL) 20 MG tablet Take 20 mg by mouth daily. Reported on 06/18/2015 02/25/15  Yes Historical Provider, MD     No Known Allergies     Social History:  reports that she has been smoking.  She does not have any smokeless tobacco history on file. She reports that she does not drink alcohol or use illicit drugs.    Family History  Problem Relation Age of Onset  . Stroke Mother   . Diabetes Mother   . Hypertension Mother        Physical Exam:  GEN:  Pleasant and Anxious Well Nourished and Well Developed 65 y.o. Asian female examined and in no acute distress; cooperative with exam Filed Vitals:   06/18/15 2019 06/18/15 2157 06/18/15 2334 06/19/15 0044  BP: 222/86 207/62 119/65 195/66  Pulse:  75 74 74  Temp:      TempSrc:      Resp:  Height:      Weight:      SpO2:  98% 96% 98%   Blood pressure 195/66, pulse 74, temperature 98.3 F (36.8 C), temperature source Oral, resp. rate 15, height 5' (1.524 m), weight 58.06 kg (128 lb), SpO2 98 %. PSYCH: She is alert and oriented x4; does not appear anxious does not appear depressed; affect is normal HEENT: Normocephalic and Atraumatic, Mucous membranes  pink; PERRLA; EOM intact; Fundi:  Benign;  No scleral icterus, Nares: Patent, Oropharynx: Clear, Fair Dentition,    Neck:  FROM, No Cervical Lymphadenopathy nor Thyromegaly or Carotid Bruit; No JVD; Breasts:: Not examined CHEST WALL: No tenderness CHEST: Normal respiration, clear to auscultation bilaterally HEART: Regular rate and rhythm; no murmurs rubs or ABDOMEN:   +Bowel Sounds, Soft Non-Tender, No Rebound or Guarding; No Masses, No Organomegaly. Rectal Exam: Not done EXTREMITIES: No Cyanosis, Clubbing, or Edema; No Ulcerations. Genitalia: not examined PULSES: 2+ and symmetric SKIN: Normal hydration no rash or ulceration CNS:  Alert and Oriented x 4, No Focal Deficits Vascular: pulses palpable throughout    Labs on Admission:  Basic Metabolic Panel:  Recent Labs Lab 06/18/15 2303  NA 132*  K 3.7  CL 99*  CO2 22  GLUCOSE 117*  BUN 14  CREATININE 0.78  CALCIUM 9.3   Liver Function Tests: No results for input(s): AST, ALT, ALKPHOS, BILITOT, PROT, ALBUMIN in the last 168 hours. No results for input(s): LIPASE, AMYLASE in the last 168 hours. No results for input(s): AMMONIA in the last 168 hours. CBC:  Recent Labs Lab 06/18/15 2303  WBC 6.1  NEUTROABS 3.4  HGB 12.7  HCT 36.8  MCV 88.0  PLT 252   Cardiac Enzymes:  Recent Labs Lab 06/18/15 2303  TROPONINI 0.04*    BNP (last 3 results) No results for input(s): BNP in the last 8760 hours.  ProBNP (last 3 results) No results for input(s): PROBNP in the last 8760 hours.  CBG: No results for input(s): GLUCAP in the last 168 hours.  Radiological Exams on Admission: No results found.   EKG: Independently reviewed. Normal Sinus Rhythm rate = 84    Assessment/Plan:       65 y.o. female with  Principal Problem:    Hypertensive urgency   IV Hydralazine PRN   Monitor BPs   Active Problems:    Elevated troponin   Cardiac Monitoring   Cycle Troponins   Nitropaste, O2, ASA     Chest pain   Cardiac  Monitoring   Cycle Troponins      Thyroid disease- Hx of RAI with resultant Hypothyroid, now on replacement   Continue Levothyroxine Rx   Check TSH      Anxiety   IV Ativan x 1    DVT Prophylaxis   Lovenox     Code Status:     FULL CODE       Family Communication:   Family Friend at Bedside     Disposition Plan:   Observation Status        Time spent:  54 Minutes      Ron Parker [facility] Pager [phone]   If 7AM -7PM Please Contact the Day Rounding Team MD for [facility]  If 7PM-7AM, Please Contact Night-Floor Coverage  [email or web address] Password [email or web address] 06/19/2015, 1:00 AM     ADDENDUM:   Patient was seen and examined  on 06/19/2015

## 2015-06-19 NOTE — ED Provider Notes (Signed)
CSN: [id number]     Arrival date & time 06/18/15  1955 History   First MD Initiated Contact with Patient 06/18/15 2130     Chief Complaint  Patient presents with  . Hypertension     (Consider location/radiation/quality/duration/timing/severity/associated sxs/prior Treatment) HPI Comments: Pt comes in with cc of elevated BP. Pt has hx of HTN. Reports that in March her PCP changed her medication for BP. She is unsure why. It seems like pt was on lisinopril 10 - HCTZ 12.5, and she was asked to just take HCTZ 12.5 mg. She reports that she had seen her a Cardiologist in Jan for abnormal EKG, and they had recommended her to get a good BP monitor and check BP occasionally, so she has been doing that lately and has noted several elevated BP readings. Pt has some chest discomfort, that she describes as feeling "anxious" or "worried" type discomfort. She has no dib, headaches, numbness, tingling, dizziness.  BP here is 225/90 when i was in the room.   ROS 10 Systems reviewed and are negative for acute change except as noted in the HPI.      Patient is a 65 y.o. female presenting with hypertension. The history is provided by the patient.  Hypertension    Past Medical History  Diagnosis Date  . Hearing loss   . Thyroid disease     "thyroid problem" unable to explain   Past Surgical History  Procedure Laterality Date  . Tonsillectomy     Family History  Problem Relation Age of Onset  . Stroke Mother   . Diabetes Mother   . Hypertension Mother    Social History  Substance Use Topics  . Smoking status: Current Every Day Smoker  . Smokeless tobacco: None  . Alcohol Use: No   OB History    No data available     Review of Systems    Allergies  Review of patient's allergies indicates no known allergies.  Home Medications   Prior to Admission medications   Medication Sig Start Date End Date Taking? Authorizing Provider  calcium carbonate (OSCAL) 1500 (600 Ca) MG TABS  tablet Take 600 mg of elemental calcium by mouth daily.   Yes Historical Provider, MD  hydrochlorothiazide (HYDRODIURIL) 12.5 MG tablet Take 12.5 mg by mouth daily. 06/01/15  Yes Historical Provider, MD  levothyroxine (SYNTHROID, LEVOTHROID) 75 MCG tablet Take 75 mcg by mouth daily. 06/01/15  Yes Historical Provider, MD  pravastatin (PRAVACHOL) 20 MG tablet Take 20 mg by mouth daily. Reported on 06/18/2015 02/25/15  Yes Historical Provider, MD   BP 195/66 mmHg  Pulse 74  Temp(Src) 98.3 F (36.8 C) (Oral)  Resp 15  Ht 5' (1.524 m)  Wt 128 lb (58.06 kg)  BMI 25.00 kg/m2  SpO2 98% Physical Exam  Constitutional: She is oriented to person, place, and time. She appears well-developed.  HENT:  Head: Normocephalic and atraumatic.  Eyes: EOM are normal.  Neck: Normal range of motion. Neck supple.  Cardiovascular: Normal rate.   No murmur heard. Pulmonary/Chest: Effort normal.  Abdominal: Bowel sounds are normal.  Neurological: She is alert and oriented to person, place, and time. No cranial nerve deficit. Coordination normal.  Skin: Skin is warm and dry.  Nursing note and vitals reviewed.   ED Course  Procedures (including critical care time) Labs Review Labs Reviewed  BASIC METABOLIC PANEL - Abnormal; Notable for the following:    Sodium 132 (*)    Chloride 99 (*)    Glucose,  Bld 117 (*)    All other components within normal limits  TROPONIN I - Abnormal; Notable for the following:    Troponin I 0.04 (*)    All other components within normal limits  CBC WITH DIFFERENTIAL/PLATELET    Imaging Review No results found. I have personally reviewed and evaluated these images and lab results as part of my medical decision-making.   EKG Interpretation   Date/Time:  Saturday June 18 2015 23:06:05 EDT Ventricular Rate:  84 PR Interval:  141 QRS Duration: 96 QT Interval:  397 QTC Calculation: 469 R Axis:   60 Text Interpretation:  Sinus rhythm Left atrial enlargement Inferior   infarct, age indeterminate Lateral leads are also involved [id number] pattern   Nonspecific ST and T wave abnormality Confirmed by Rhunette Croft, MD, Janey Genta  [phone]) on 06/18/2015 11:41:24 PM      MDM   Final diagnoses:  Elevated troponin  Hypertensive emergency    Pt comes in with elevated BP. She has had recent med change - and that is likely the cause. She has non acute EKG changes that do suggest some abnormalities, and atypical chest discomfort - so with the pressure elevation we did get trop level which is at the higher end of normal. We will admit for BP optimization.     Derwood Kaplan, MD 06/19/15 [phone]

## 2015-06-19 NOTE — ED Notes (Signed)
Per Dr.Nanavati administer 10mg  Lisinopril PO. MD also aware of labs.

## 2015-06-19 NOTE — ED Notes (Signed)
MD at bedside. 

## 2015-06-20 ENCOUNTER — Ambulatory Visit (HOSPITAL_COMMUNITY)
Admit: 2015-06-20 | Discharge: 2015-06-20 | Disposition: A | Discharge status: Home / Self Care | Payer: PPO | Attending: Internal Medicine | Admitting: Internal Medicine

## 2015-06-20 DIAGNOSIS — R079 Chest pain, unspecified: Secondary | ICD-10-CM | POA: Diagnosis not present

## 2015-06-20 DIAGNOSIS — T502X5A Adverse effect of carbonic-anhydrase inhibitors, benzothiadiazides and other diuretics, initial encounter: Secondary | ICD-10-CM | POA: Diagnosis not present

## 2015-06-20 DIAGNOSIS — I160 Hypertensive urgency: Secondary | ICD-10-CM | POA: Diagnosis not present

## 2015-06-20 DIAGNOSIS — H9190 Unspecified hearing loss, unspecified ear: Secondary | ICD-10-CM | POA: Diagnosis not present

## 2015-06-20 DIAGNOSIS — E878 Other disorders of electrolyte and fluid balance, not elsewhere classified: Secondary | ICD-10-CM | POA: Diagnosis not present

## 2015-06-20 DIAGNOSIS — R7989 Other specified abnormal findings of blood chemistry: Secondary | ICD-10-CM | POA: Diagnosis not present

## 2015-06-20 DIAGNOSIS — Z79899 Other long term (current) drug therapy: Secondary | ICD-10-CM | POA: Diagnosis not present

## 2015-06-20 DIAGNOSIS — R0789 Other chest pain: Secondary | ICD-10-CM | POA: Diagnosis not present

## 2015-06-20 DIAGNOSIS — Z7982 Long term (current) use of aspirin: Secondary | ICD-10-CM | POA: Diagnosis not present

## 2015-06-20 DIAGNOSIS — E871 Hypo-osmolality and hyponatremia: Secondary | ICD-10-CM | POA: Diagnosis not present

## 2015-06-20 DIAGNOSIS — I10 Essential (primary) hypertension: Secondary | ICD-10-CM | POA: Diagnosis not present

## 2015-06-20 DIAGNOSIS — E860 Dehydration: Secondary | ICD-10-CM | POA: Diagnosis not present

## 2015-06-20 DIAGNOSIS — F411 Generalized anxiety disorder: Secondary | ICD-10-CM | POA: Diagnosis not present

## 2015-06-20 DIAGNOSIS — E785 Hyperlipidemia, unspecified: Secondary | ICD-10-CM | POA: Diagnosis not present

## 2015-06-20 LAB — NM MYOCAR MULTI W/SPECT W/WALL MOTION / EF
CHL CUP RESTING HR STRESS: 74 {beats}/min
CSEPPHR: 108 {beats}/min

## 2015-06-20 LAB — BASIC METABOLIC PANEL
ANION GAP: 9 (ref 5–15)
BUN: 23 mg/dL — ABNORMAL HIGH (ref 6–20)
CALCIUM: 9.1 mg/dL (ref 8.9–10.3)
CO2: 22 mmol/L (ref 22–32)
Chloride: 99 mmol/L — ABNORMAL LOW (ref 101–111)
Creatinine, Ser: 1.15 mg/dL — ABNORMAL HIGH (ref 0.44–1.00)
GFR, EST AFRICAN AMERICAN: 56 mL/min — AB (ref >60)
GFR, EST NON AFRICAN AMERICAN: 48 mL/min — AB (ref >60)
GLUCOSE: 98 mg/dL (ref 65–99)
POTASSIUM: 4.8 mmol/L (ref 3.5–5.1)
SODIUM: 130 mmol/L — AB (ref 135–145)

## 2015-06-20 MED ORDER — TECHNETIUM TC 99M SESTAMIBI GENERIC - CARDIOLITE
10.0000 | Freq: Once | INTRAVENOUS | Status: AC | PRN
  Administered 2015-06-20: 10 via INTRAVENOUS

## 2015-06-20 MED ORDER — TRIAMTERENE-HCTZ 37.5-25 MG PO TABS: 1.0000 | ORAL_TABLET | Freq: Every day | ORAL | Status: DC

## 2015-06-20 MED ORDER — AMLODIPINE BESYLATE 5 MG PO TABS: 5.0000 mg | ORAL_TABLET | Freq: Two times a day (BID) | ORAL | Status: DC

## 2015-06-20 MED ORDER — ENSURE ENLIVE PO LIQD: 237.0000 mL | Freq: Two times a day (BID) | ORAL | Status: DC

## 2015-06-20 MED ORDER — TECHNETIUM TC 99M SESTAMIBI GENERIC - CARDIOLITE
30.0000 | Freq: Once | INTRAVENOUS | Status: AC | PRN
  Administered 2015-06-20: 30 via INTRAVENOUS

## 2015-06-20 MED ORDER — ASPIRIN EC 81 MG PO TBEC: 81.0000 mg | DELAYED_RELEASE_TABLET | Freq: Every day | ORAL | Status: DC

## 2015-06-20 MED ORDER — REGADENOSON 0.4 MG/5ML IV SOLN
0.4000 mg | Freq: Once | INTRAVENOUS | Status: AC
  Administered 2015-06-20: 0.4 mg via INTRAVENOUS

## 2015-06-20 MED ORDER — METOPROLOL SUCCINATE ER 25 MG PO TB24: 25.0000 mg | ORAL_TABLET | Freq: Every day | ORAL | Status: DC

## 2015-06-20 MED ORDER — REGADENOSON 0.4 MG/5ML IV SOLN
INTRAVENOUS | Status: AC
  Filled 2015-06-20: qty 5

## 2015-06-20 NOTE — Progress Notes (Addendum)
Subjective: No CP or SOB  Objective: Vital signs in last 24 hours: Temp:  [98.2 F (36.8 C)-98.4 F (36.9 C)] 98.4 F (36.9 C) (04/17 0508) Pulse Rate:  [55-74] 74 (04/17 0940) Resp:  [18] 18 (04/17 0508) BP: (124-180)/(59-80) 154/79 mmHg (04/17 0940) SpO2:  [98 %-99 %] 99 % (04/17 0508) Weight:  [117 lb 15.1 oz (53.5 kg)] 117 lb 15.1 oz (53.5 kg) (04/17 0508) Last BM Date: 06/18/15  Intake/Output from previous day: 04/16 0701 - 04/17 0700 In: 180 [P.O.:180] Out: 2 [Urine:2] Intake/Output this shift:    Medications Scheduled Meds: . amLODipine  5 mg Oral BID  . aspirin  325 mg Oral Daily  . calcium carbonate  1,250 mg Oral Daily  . enoxaparin (LOVENOX) injection  40 mg Subcutaneous Q24H  . feeding supplement (ENSURE ENLIVE)  237 mL Oral BID BM  . levothyroxine  75 mcg Oral QAC breakfast  . pantoprazole  40 mg Oral Daily  . pravastatin  20 mg Oral q1800  . sodium chloride flush  3 mL Intravenous Q12H  . sodium chloride flush  3 mL Intravenous Q12H  . triamterene-hydrochlorothiazide  1 tablet Oral Daily   Continuous Infusions:  PRN Meds:.sodium chloride, acetaminophen **OR** acetaminophen, hydrALAZINE, HYDROmorphone (DILAUDID) injection, ondansetron **OR** ondansetron (ZOFRAN) IV, oxyCODONE, sodium chloride flush  PE: General appearance: alert, cooperative, no distress and Laying flat. Lungs: clear to auscultation bilaterally Heart: regularly irregular rhythm and No MRG Extremities: No LEE Pulses: 2+ and symmetric Skin: Warm and dry Neurologic: Grossly normal  Lab Results:   Recent Labs  06/18/15 2303 06/19/15 0525  WBC 6.1 4.9  HGB 12.7 11.8*  HCT 36.8 34.3*  PLT 252 253   BMET  Recent Labs  06/18/15 2303 06/19/15 0525 06/20/15 0438  NA 132* 132* 130*  K 3.7 3.4* 4.8  CL 99* 97* 99*  CO2 22 25 22   GLUCOSE 117* 97 98  BUN 14 12 23*  CREATININE 0.78 0.77 1.15*  CALCIUM 9.3 9.3 9.1   Cardiac Panel (last 3 results)  Recent Labs   06/19/15 0525 06/19/15 1153 06/19/15 1813  TROPONINI 0.04* 0.03 0.03      Assessment/Plan  Pt is a 65 yo with history of HTN Admitted with yesterday BP severely elevated at time and pt had " funny feeling in chest" Nervous?  Since admit BP has improvedsome Feeling in chest is gone PT and daughter say that her BP is high at home She has attrib to being nervous  Was on lisinopril but stopped because ? Loss of taste  Principal Problem:   Hypertensive urgency Active Problems:   Thyroid disease   Elevated troponin   Anxiety   Chest pain   Chest pressure  First Troponin 0.04 then 0.03 x 2.  She is here in nuc Med for a Lexiscan stress test.  BP improved.  Amlodipine 5, maxide 37.5/25.  Results to follow.   LOS: 1 day    HAGER, BRYAN PA-C 06/20/2015 10:07 AM  The patient has been seen in conjunction with Theodore Demarkhonda Barrett, PAC. All aspects of care have been considered and discussed. The patient has been personally interviewed, examined, and all clinical data has been reviewed.   EKGs did not reveal evidence of ischemia. Cardiac markers are negative for evidence of injury.  My initial appraisal of the nuclear images is that the study will be low risk. The study was a pharmacologic perfusion study.  Blood pressures are elevated and I therefore would recommend amlodipine and  diuretic therapy (I'm okay with the current dose of Maxide). The therapy can be further titrated as an outpatient.  Ready for discharge unless a surprising nuclear reading is generated.

## 2015-06-20 NOTE — Discharge Summary (Signed)
Physician Discharge Summary  Karina [name] QQV:[id number] DOB: [birth year] DOA: 06/18/2015  PCP: Corine Shelter, PA-C  Admit date: 06/18/2015 Discharge date: 06/20/2015  Time spent: 35 minutes  Recommendations for Outpatient Follow-up:  Needs B-met in 48 hours to follow sodium level and renal function   Discharge Diagnoses:    Hypertensive emergency    Thyroid disease   Elevated troponin   Anxiety   Chest pain   Chest pressure   Discharge Condition: stable  Diet recommendation: heart healthy   Filed Weights   06/18/15 2017 06/19/15 0350 06/20/15 0508  Weight: 58.06 kg (128 lb) 53.4 kg (117 lb 11.6 oz) 53.5 kg (117 lb 15.1 oz)    History of present illness:  Karina [name] is a 65 y.o. female with a history of HTN, Hypothyroid who presents to the ED with complaints of elevated blood pressures at home worse the past day. She reports her blood pressures have been elevated since her blood pressures medications had been changed and her Lisinopril had been discontinued in March. She has been hypervigilant with checking of her blood pressures She reports having headaches, and also reports that her chest does not feel good. She was administered Lisinopril PO x 1 in the ED, and was evaluated and was found to have a positive Troponin level at 0.04 and was referred for observation.   Hospital Course:  1-HTN emergency; Patient presents with chest discomfort, elevated BP.  Started on Maxzide, Norvasc 5 mg BID and metoprolol PRN hydralazine.  Further titration outpatient.  Needs B-met to follow sodium level and renal function.   2-Chest discomfort, Patient report " bad feeling, something wrong in her chest.  Check chest x ray.  Mildly elevated troponin might be related to HTN  Cardiology consulted.  Stress test pending, if low risk will plan to discharge patient today.   3-Hypothyroid;  Continue with synthroid.  Repeated TSH; normal.   Procedures:  Stress test pending.    Consultations:  Cardiology   Discharge Exam: Filed Vitals:   06/20/15 0747 06/20/15 1414  BP: 144/74 172/70  Pulse:  78  Temp:  97.2 F (36.2 C)  Resp:  16    General: NAD Cardiovascular: S 1, S 2 RRR Respiratory: CTA  Discharge Instructions    Current Discharge Medication List    START taking these medications   Details  amLODipine (NORVASC) 5 MG tablet Take 1 tablet (5 mg total) by mouth 2 (two) times daily. Qty: 60 tablet, Refills: 0    aspirin EC 81 MG tablet Take 1 tablet (81 mg total) by mouth daily. Qty: 30 tablet, Refills: 0    feeding supplement, ENSURE ENLIVE, (ENSURE ENLIVE) LIQD Take 237 mLs by mouth 2 (two) times daily between meals. Qty: 237 mL, Refills: 12    metoprolol succinate (TOPROL-XL) 25 MG 24 hr tablet Take 1 tablet (25 mg total) by mouth daily. Qty: 30 tablet, Refills: 0    triamterene-hydrochlorothiazide (MAXZIDE-25) 37.5-25 MG tablet Take 1 tablet by mouth daily. Qty: 30 tablet, Refills: 0      CONTINUE these medications which have NOT CHANGED   Details  calcium carbonate (OSCAL) 1500 (600 Ca) MG TABS tablet Take 600 mg of elemental calcium by mouth daily.    levothyroxine (SYNTHROID, LEVOTHROID) 75 MCG tablet Take 75 mcg by mouth daily. Refills: 0    pravastatin (PRAVACHOL) 20 MG tablet Take 20 mg by mouth daily. Reported on 06/18/2015 Refills: 1      STOP taking these medications  hydrochlorothiazide (HYDRODIURIL) 12.5 MG tablet        No Known Allergies    The results of significant diagnostics from this hospitalization (including imaging, microbiology, ancillary and laboratory) are listed below for reference.    Significant Diagnostic Studies: Dg Chest 2 View  06/19/2015  CLINICAL DATA:  65 year old female with high blood pressure. EXAM: CHEST  2 VIEW COMPARISON:  No priors. FINDINGS: Lung volumes are normal. No consolidative airspace disease. No pleural effusions. No pneumothorax. No pulmonary nodule or mass  noted. Pulmonary vasculature and the cardiomediastinal silhouette are within normal limits. Atherosclerosis in the thoracic aorta. IMPRESSION: 1.  No radiographic evidence of acute cardiopulmonary disease. 2. Atherosclerosis. Electronically Signed   By: Vinnie Langton M.D.   On: 06/19/2015 11:54    Microbiology: No results found for this or any previous visit (from the past 240 hour(s)).   Labs: Basic Metabolic Panel:  Recent Labs Lab 06/18/15 2303 06/19/15 0525 06/20/15 0438  NA 132* 132* 130*  K 3.7 3.4* 4.8  CL 99* 97* 99*  CO2 22 25 22   GLUCOSE 117* 97 98  BUN 14 12 23*  CREATININE 0.78 0.77 1.15*  CALCIUM 9.3 9.3 9.1   Liver Function Tests: No results for input(s): AST, ALT, ALKPHOS, BILITOT, PROT, ALBUMIN in the last 168 hours. No results for input(s): LIPASE, AMYLASE in the last 168 hours. No results for input(s): AMMONIA in the last 168 hours. CBC:  Recent Labs Lab 06/18/15 2303 06/19/15 0525  WBC 6.1 4.9  NEUTROABS 3.4  --   HGB 12.7 11.8*  HCT 36.8 34.3*  MCV 88.0 87.5  PLT 252 253   Cardiac Enzymes:  Recent Labs Lab 06/18/15 2303 06/19/15 0525 06/19/15 1153 06/19/15 1813  TROPONINI 0.04* 0.04* 0.03 0.03   BNP: BNP (last 3 results) No results for input(s): BNP in the last 8760 hours.  ProBNP (last 3 results) No results for input(s): PROBNP in the last 8760 hours.  CBG: No results for input(s): GLUCAP in the last 168 hours.     Signed:  Elmarie Shiley MD.  [facility] 06/20/2015, 2:58 PM

## 2015-06-22 ENCOUNTER — Encounter (HOSPITAL_COMMUNITY): Payer: Self-pay | Admitting: Emergency Medicine

## 2015-06-22 ENCOUNTER — Inpatient Hospital Stay (HOSPITAL_COMMUNITY)
Admission: EM | Admit: 2015-06-22 | Discharge: 2015-06-24 | DRG: 641 | Disposition: A | Discharge status: Home / Self Care | Payer: PPO | Attending: Internal Medicine | Admitting: Internal Medicine

## 2015-06-22 ENCOUNTER — Other Ambulatory Visit: Payer: Self-pay

## 2015-06-22 DIAGNOSIS — I10 Essential (primary) hypertension: Secondary | ICD-10-CM | POA: Diagnosis present

## 2015-06-22 DIAGNOSIS — Z79899 Other long term (current) drug therapy: Secondary | ICD-10-CM

## 2015-06-22 DIAGNOSIS — I119 Hypertensive heart disease without heart failure: Secondary | ICD-10-CM | POA: Diagnosis present

## 2015-06-22 DIAGNOSIS — Z7982 Long term (current) use of aspirin: Secondary | ICD-10-CM

## 2015-06-22 DIAGNOSIS — R079 Chest pain, unspecified: Secondary | ICD-10-CM | POA: Diagnosis not present

## 2015-06-22 DIAGNOSIS — E878 Other disorders of electrolyte and fluid balance, not elsewhere classified: Secondary | ICD-10-CM | POA: Diagnosis present

## 2015-06-22 DIAGNOSIS — E785 Hyperlipidemia, unspecified: Secondary | ICD-10-CM | POA: Diagnosis not present

## 2015-06-22 DIAGNOSIS — E871 Hypo-osmolality and hyponatremia: Secondary | ICD-10-CM

## 2015-06-22 DIAGNOSIS — R7989 Other specified abnormal findings of blood chemistry: Secondary | ICD-10-CM | POA: Diagnosis not present

## 2015-06-22 DIAGNOSIS — H9190 Unspecified hearing loss, unspecified ear: Secondary | ICD-10-CM | POA: Diagnosis present

## 2015-06-22 DIAGNOSIS — E860 Dehydration: Secondary | ICD-10-CM | POA: Diagnosis present

## 2015-06-22 DIAGNOSIS — I160 Hypertensive urgency: Secondary | ICD-10-CM | POA: Diagnosis not present

## 2015-06-22 DIAGNOSIS — T502X5A Adverse effect of carbonic-anhydrase inhibitors, benzothiadiazides and other diuretics, initial encounter: Secondary | ICD-10-CM | POA: Diagnosis not present

## 2015-06-22 DIAGNOSIS — R0789 Other chest pain: Secondary | ICD-10-CM | POA: Diagnosis not present

## 2015-06-22 DIAGNOSIS — F411 Generalized anxiety disorder: Secondary | ICD-10-CM | POA: Diagnosis not present

## 2015-06-22 HISTORY — DX: Essential (primary) hypertension: I10

## 2015-06-22 LAB — CBC WITH DIFFERENTIAL/PLATELET
BASOS ABS: 0 10*3/uL (ref 0.0–0.1)
BASOS PCT: 0 %
EOS PCT: 0 %
Eosinophils Absolute: 0 10*3/uL (ref 0.0–0.7)
HEMATOCRIT: 36 % (ref 36.0–46.0)
Hemoglobin: 13.5 g/dL (ref 12.0–15.0)
LYMPHS PCT: 22 %
Lymphs Abs: 1.5 10*3/uL (ref 0.7–4.0)
MCH: 31 pg (ref 26.0–34.0)
MCHC: 37.5 g/dL — AB (ref 30.0–36.0)
MCV: 82.6 fL (ref 78.0–100.0)
MONO ABS: 0.5 10*3/uL (ref 0.1–1.0)
MONOS PCT: 7 %
NEUTROS ABS: 4.8 10*3/uL (ref 1.7–7.7)
Neutrophils Relative %: 71 %
Platelets: 285 10*3/uL (ref 150–400)
RBC: 4.36 MIL/uL (ref 3.87–5.11)
RDW: 12.2 % (ref 11.5–15.5)
WBC: 6.8 10*3/uL (ref 4.0–10.5)

## 2015-06-22 LAB — BASIC METABOLIC PANEL
Anion gap: 12 (ref 5–15)
BUN: 17 mg/dL (ref 6–20)
CALCIUM: 9.6 mg/dL (ref 8.9–10.3)
CO2: 23 mmol/L (ref 22–32)
CREATININE: 0.94 mg/dL (ref 0.44–1.00)
Chloride: 79 mmol/L — ABNORMAL LOW (ref 101–111)
GFR calc Af Amer: >60 mL/min (ref >60)
GLUCOSE: 114 mg/dL — AB (ref 65–99)
Potassium: 3.5 mmol/L (ref 3.5–5.1)
Sodium: 114 mmol/L — CL (ref 135–145)

## 2015-06-22 MED ORDER — ASPIRIN EC 81 MG PO TBEC
81.0000 mg | DELAYED_RELEASE_TABLET | Freq: Every day | ORAL | Status: DC
  Administered 2015-06-23 – 2015-06-24 (×2): 81 mg via ORAL
  Filled 2015-06-22 (×4): qty 1

## 2015-06-22 MED ORDER — CETYLPYRIDINIUM CHLORIDE 0.05 % MT LIQD
7.0000 mL | Freq: Two times a day (BID) | OROMUCOSAL | Status: DC
  Administered 2015-06-23: 7 mL via OROMUCOSAL

## 2015-06-22 MED ORDER — SODIUM CHLORIDE 0.9 % IV SOLN
INTRAVENOUS | Status: DC
  Administered 2015-06-22 – 2015-06-23 (×2): 1000 mL via INTRAVENOUS

## 2015-06-22 MED ORDER — PRAVASTATIN SODIUM 20 MG PO TABS
20.0000 mg | ORAL_TABLET | Freq: Every day | ORAL | Status: DC
  Administered 2015-06-23 – 2015-06-24 (×3): 20 mg via ORAL
  Filled 2015-06-22 (×4): qty 1

## 2015-06-22 MED ORDER — CALCIUM CARBONATE 1500 (600 CA) MG PO TABS
600.0000 mg | ORAL_TABLET | Freq: Every day | ORAL | Status: DC
  Filled 2015-06-22: qty 1

## 2015-06-22 MED ORDER — CHLORHEXIDINE GLUCONATE 0.12 % MT SOLN
15.0000 mL | Freq: Two times a day (BID) | OROMUCOSAL | Status: DC
  Administered 2015-06-23 – 2015-06-24 (×4): 15 mL via OROMUCOSAL
  Filled 2015-06-22 (×4): qty 15

## 2015-06-22 MED ORDER — AMLODIPINE BESYLATE 5 MG PO TABS
5.0000 mg | ORAL_TABLET | Freq: Two times a day (BID) | ORAL | Status: DC
  Administered 2015-06-23 – 2015-06-24 (×4): 5 mg via ORAL
  Filled 2015-06-22 (×6): qty 1

## 2015-06-22 MED ORDER — SERTRALINE HCL 50 MG PO TABS
50.0000 mg | ORAL_TABLET | Freq: Every day | ORAL | Status: DC
  Filled 2015-06-22: qty 1

## 2015-06-22 MED ORDER — SODIUM CHLORIDE 0.9 % IV BOLUS (SEPSIS): 1000.0000 mL | Freq: Once | INTRAVENOUS | Status: DC

## 2015-06-22 MED ORDER — ENOXAPARIN SODIUM 40 MG/0.4ML ~~LOC~~ SOLN
40.0000 mg | SUBCUTANEOUS | Status: DC
  Administered 2015-06-23 (×2): 40 mg via SUBCUTANEOUS
  Filled 2015-06-22 (×3): qty 0.4

## 2015-06-22 MED ORDER — LEVOTHYROXINE SODIUM 50 MCG PO TABS
75.0000 ug | ORAL_TABLET | Freq: Every day | ORAL | Status: DC
  Administered 2015-06-23 – 2015-06-24 (×2): 75 ug via ORAL
  Filled 2015-06-22 (×3): qty 1

## 2015-06-22 MED ORDER — SODIUM CHLORIDE 0.9 % IV BOLUS (SEPSIS)
1000.0000 mL | Freq: Once | INTRAVENOUS | Status: AC
  Administered 2015-06-22: 1000 mL via INTRAVENOUS

## 2015-06-22 MED ORDER — ENSURE ENLIVE PO LIQD
237.0000 mL | Freq: Two times a day (BID) | ORAL | Status: DC
  Administered 2015-06-23 – 2015-06-24 (×2): 237 mL via ORAL

## 2015-06-22 NOTE — ED Notes (Signed)
PA at bedside.

## 2015-06-22 NOTE — ED Notes (Signed)
Pt reports was called by PCP and sent to ED for low sodium. Pt reports dizziness and nausea. Denies pain . Alert and oriented x 4.

## 2015-06-22 NOTE — H&P (Signed)
[facility] History and Physical  Karina [name] [id number] DOB: 24-Sep-1950 DOA: 06/22/2015  Referring physician: EDP PCP: Ethel RanaHEPLER,MARK, PA-C   Chief Complaint: Low sodium   HPI: Karina [name] is a 65 y.o. female with h/o HTN urgency admission to our service, just discharged 2 days ago with new med Maxide.  Since discharge she has developed dizziness, 1 episode of vomiting today.  She went to her PCP had lab work drawn which showed "dangerously low" sodium.  She got sent in to the ED from her PCPs office.  In ED, sodium is 114 this is down from 130 just 2 days ago.  Last dose of Maxide she took was this morning at 9:57 AM (quite conveniently she writes down specific times she takes meds on a piece of paper that she has with her).  Review of Systems: Systems reviewed.  As above, otherwise negative  Past Medical History  Diagnosis Date  . Hearing loss   . Thyroid disease     "thyroid problem" unable to explain   Past Surgical History  Procedure Laterality Date  . Tonsillectomy     Social History:  reports that she has been smoking.  She does not have any smokeless tobacco history on file. She reports that she does not drink alcohol or use illicit drugs.  No Known Allergies  Family History  Problem Relation Age of Onset  . Stroke Mother   . Diabetes Mother   . Hypertension Mother      Prior to Admission medications   Medication Sig Start Date End Date Taking? Authorizing Provider  amLODipine (NORVASC) 5 MG tablet Take 1 tablet (5 mg total) by mouth 2 (two) times daily. 06/20/15  Yes Belkys A Regalado, MD  aspirin EC 81 MG tablet Take 1 tablet (81 mg total) by mouth daily. 06/20/15  Yes Belkys A Regalado, MD  calcium carbonate (OSCAL) 1500 (600 Ca) MG TABS tablet Take 600 mg of elemental calcium by mouth daily.   Yes Historical Provider, MD  feeding supplement, ENSURE ENLIVE, (ENSURE ENLIVE) LIQD Take 237 mLs by mouth 2 (two) times daily between meals. 06/20/15  Yes  Belkys A Regalado, MD  levothyroxine (SYNTHROID, LEVOTHROID) 75 MCG tablet Take 75 mcg by mouth daily. 06/01/15  Yes Historical Provider, MD  pravastatin (PRAVACHOL) 20 MG tablet Take 20 mg by mouth daily. Reported on 06/18/2015 02/25/15  Yes Historical Provider, MD  sertraline (ZOLOFT) 50 MG tablet Take 50 mg by mouth daily. 1/2 tablet x7 days then increase to 1 tablet at night.   Yes Historical Provider, MD   Physical Exam: Filed Vitals:   06/22/15 2015 06/22/15 2100  BP: 147/69 145/66  Pulse: 83 84  Temp:    Resp: 18 15    BP 145/66 mmHg  Pulse 84  Temp(Src) 97.9 F (36.6 C) (Oral)  Resp 15  SpO2 97%  General Appearance:    Alert, oriented, no distress, appears stated age  Head:    Normocephalic, atraumatic  Eyes:    PERRL, EOMI, sclera non-icteric        Nose:   Nares without drainage or epistaxis. Mucosa, turbinates normal  Throat:   Moist mucous membranes. Oropharynx without erythema or exudate.  Neck:   Supple. No carotid bruits.  No thyromegaly.  No lymphadenopathy.   Back:     No CVA tenderness, no spinal tenderness  Lungs:     Clear to auscultation bilaterally, without wheezes, rhonchi or rales  Chest wall:    No tenderness  to palpitation  Heart:    Regular rate and rhythm without murmurs, gallops, rubs  Abdomen:     Soft, non-tender, nondistended, normal bowel sounds, no organomegaly  Genitalia:    deferred  Rectal:    deferred  Extremities:   No clubbing, cyanosis or edema.  Pulses:   2+ and symmetric all extremities  Skin:   Skin color, texture, turgor normal, no rashes or lesions  Lymph nodes:   Cervical, supraclavicular, and axillary nodes normal  Neurologic:   CNII-XII intact. Normal strength, sensation and reflexes      throughout    Labs on Admission:  Basic Metabolic Panel:  Recent Labs Lab 06/18/15 2303 06/19/15 0525 06/20/15 0438 06/22/15 1929  NA 132* 132* 130* 114*  K 3.7 3.4* 4.8 3.5  CL 99* 97* 99* 79*  CO2 GLUCOSE 117* 97 98  114*  BUN 14 12 23* 17  CREATININE 0.78 0.77 1.15* 0.94  CALCIUM 9.3 9.3 9.1 9.6   Liver Function Tests: No results for input(s): AST, ALT, ALKPHOS, BILITOT, PROT, ALBUMIN in the last 168 hours. No results for input(s): LIPASE, AMYLASE in the last 168 hours. No results for input(s): AMMONIA in the last 168 hours. CBC:  Recent Labs Lab 06/18/15 2303 06/19/15 0525 06/22/15 1929  WBC 6.1 4.9 6.8  NEUTROABS 3.4  --  4.8  HGB 12.7 11.8* 13.5  HCT 36.8 34.3* 36.0  MCV 88.0 87.5 82.6  PLT 252 253 285   Cardiac Enzymes:  Recent Labs Lab 06/18/15 2303 06/19/15 0525 06/19/15 1153 06/19/15 1813  TROPONINI 0.04* 0.04* 0.03 0.03    BNP (last 3 results) No results for input(s): PROBNP in the last 8760 hours. CBG: No results for input(s): GLUCAP in the last 168 hours.  Radiological Exams on Admission: No results found.  EKG: Independently reviewed  Assessment/Plan Principal Problem:   Acute hyponatremia Active Problems:   Hypochloremia   HTN (hypertension)   1. Acute hyponatremia - as side effect of thiazide diuretic 1. Stopping thiazide diuretic last dose was 12 hours ago 2. NS, 1L bolus in ED, followed by 125 cc/hr overnight 3. Recheck BMP in AM    Code Status: Full  Family Communication: Family at bedside Disposition Plan: Admit to inpatient   Time spent: 70 min  Velita Quirk M. [facility] Pager [phone]  If 7AM-7PM, please contact the day team taking care of the patient [email or web address] Password [email or web address] 06/22/2015, 9:50 PM

## 2015-06-22 NOTE — ED Provider Notes (Signed)
CSN: [id number]     Arrival date & time 06/22/15  1843 History   First MD Initiated Contact with Patient 06/22/15 1921     Chief Complaint  Patient presents with  . low sodium      (Consider location/radiation/quality/duration/timing/severity/associated sxs/prior Treatment) HPI Comments: 65 year old female with history of thyroid disease presents to the emergency department for reported hyponatremia. Patient was recently hospitalized in the emergency department for hypertensive emergency. Patient was discharged with changes to her blood pressure medication which she reports being compliant with. She had mild hyponatremia at time of discharge and was recommended to see her primary care doctor in 48 hours for repeat chemistry panel. She reports that she saw her primary care doctor this afternoon who drew blood. Patient was notified at 1800 by her primary care doctor that her sodium was "dangerously low" and she needed to go to the emergency department for evaluation. She states that she has been having some nausea and generalized malaise lately. She had one episode of emesis today after breakfast. She reports one episode of emesis while in the hospital, after a nuclear stress test. She has had no fever or syncope. No extremity numbness or weakness.   The history is provided by the patient. No language interpreter was used.    Past Medical History  Diagnosis Date  . Hearing loss   . Thyroid disease     "thyroid problem" unable to explain   Past Surgical History  Procedure Laterality Date  . Tonsillectomy     Family History  Problem Relation Age of Onset  . Stroke Mother   . Diabetes Mother   . Hypertension Mother    Social History  Substance Use Topics  . Smoking status: Current Every Day Smoker  . Smokeless tobacco: None  . Alcohol Use: No   OB History    No data available      Review of Systems  Constitutional:       +malaise  Gastrointestinal: Positive for nausea and  vomiting.  Ten systems reviewed and are negative for acute change, except as noted in the HPI.    Allergies  Review of patient's allergies indicates no known allergies.  Home Medications   Prior to Admission medications   Medication Sig Start Date End Date Taking? Authorizing Provider  amLODipine (NORVASC) 5 MG tablet Take 1 tablet (5 mg total) by mouth 2 (two) times daily. 06/20/15  Yes Belkys A Regalado, MD  aspirin EC 81 MG tablet Take 1 tablet (81 mg total) by mouth daily. 06/20/15  Yes Belkys A Regalado, MD  calcium carbonate (OSCAL) 1500 (600 Ca) MG TABS tablet Take 600 mg of elemental calcium by mouth daily.   Yes Historical Provider, MD  feeding supplement, ENSURE ENLIVE, (ENSURE ENLIVE) LIQD Take 237 mLs by mouth 2 (two) times daily between meals. 06/20/15  Yes Belkys A Regalado, MD  levothyroxine (SYNTHROID, LEVOTHROID) 75 MCG tablet Take 75 mcg by mouth daily. 06/01/15  Yes Historical Provider, MD  pravastatin (PRAVACHOL) 20 MG tablet Take 20 mg by mouth daily. Reported on 06/18/2015 02/25/15  Yes Historical Provider, MD  sertraline (ZOLOFT) 50 MG tablet Take 50 mg by mouth daily. 1/2 tablet x7 days then increase to 1 tablet at night.   Yes Historical Provider, MD  triamterene-hydrochlorothiazide (MAXZIDE-25) 37.5-25 MG tablet Take 1 tablet by mouth daily. 06/20/15  Yes Belkys A Regalado, MD   BP 147/69 mmHg  Pulse 83  Temp(Src) 97.9 F (36.6 C) (Oral)  Resp  18  SpO2 98%   Physical Exam  Constitutional: She is oriented to person, place, and time. She appears well-developed and well-nourished. No distress.  Nontoxic/nonseptic appearing  HENT:  Head: Normocephalic and atraumatic.  Eyes: Conjunctivae and EOM are normal. No scleral icterus.  Neck: Normal range of motion.  Cardiovascular: Normal rate, regular rhythm and intact distal pulses.   Pulmonary/Chest: Effort normal. No respiratory distress. She has no wheezes. She has no rales.  Respirations even and unlabored   Musculoskeletal: Normal range of motion.  Neurological: She is alert and oriented to person, place, and time. She exhibits normal muscle tone. Coordination normal.  Skin: Skin is warm and dry. No rash noted. She is not diaphoretic. No erythema. No pallor.  Psychiatric: She has a normal mood and affect. Her behavior is normal.  Nursing note and vitals reviewed.   ED Course  Procedures (including critical care time) Labs Review Labs Reviewed  CBC WITH DIFFERENTIAL/PLATELET - Abnormal; Notable for the following:    MCHC 37.5 (*)    All other components within normal limits  BASIC METABOLIC PANEL - Abnormal; Notable for the following:    Sodium 114 (*)    Chloride 79 (*)    Glucose, Bld 114 (*)    All other components within normal limits    Imaging Review No results found.   I have personally reviewed and evaluated these images and lab results as part of my medical decision-making.   EKG Interpretation None      MDM   Final diagnoses:  Hyponatremia  Hypochloremia    65 year old female presents to the emergency department after being instructed to be evaluated for low sodium. She has a hyponatremia of 114 today with a hypochloremia. This is likely secondary to her Maxide treatment. IV fluids ordered. [facility] to admit for further management.   Filed Vitals:   06/22/15 1848 06/22/15 1855 06/22/15 2015  BP: 192/78 186/79 147/69  Pulse: 77  83  Temp: 97.9 F (36.6 C)    TempSrc: Oral    Resp: 18  18  SpO2: 99%  98%     Antony MaduraKelly Caulin Begley, PA-C 06/22/15 2056  Loren Raceravid Yelverton, MD 06/25/15 [phone]

## 2015-06-23 ENCOUNTER — Encounter (HOSPITAL_COMMUNITY): Payer: Self-pay | Admitting: *Deleted

## 2015-06-23 DIAGNOSIS — I10 Essential (primary) hypertension: Secondary | ICD-10-CM

## 2015-06-23 DIAGNOSIS — E871 Hypo-osmolality and hyponatremia: Principal | ICD-10-CM

## 2015-06-23 LAB — BASIC METABOLIC PANEL
ANION GAP: 11 (ref 5–15)
BUN: 13 mg/dL (ref 6–20)
CALCIUM: 8.8 mg/dL — AB (ref 8.9–10.3)
CHLORIDE: 99 mmol/L — AB (ref 101–111)
CO2: 22 mmol/L (ref 22–32)
CREATININE: 0.74 mg/dL (ref 0.44–1.00)
GFR calc non Af Amer: >60 mL/min (ref >60)
Glucose, Bld: 98 mg/dL (ref 65–99)
Potassium: 3 mmol/L — ABNORMAL LOW (ref 3.5–5.1)
SODIUM: 132 mmol/L — AB (ref 135–145)

## 2015-06-23 LAB — CBC
HCT: 34.2 % — ABNORMAL LOW (ref 36.0–46.0)
HEMOGLOBIN: 12.6 g/dL (ref 12.0–15.0)
MCH: 30.8 pg (ref 26.0–34.0)
MCHC: 36.8 g/dL — AB (ref 30.0–36.0)
MCV: 83.6 fL (ref 78.0–100.0)
Platelets: 277 10*3/uL (ref 150–400)
RBC: 4.09 MIL/uL (ref 3.87–5.11)
RDW: 12.4 % (ref 11.5–15.5)
WBC: 5.7 10*3/uL (ref 4.0–10.5)

## 2015-06-23 MED ORDER — LOSARTAN POTASSIUM 50 MG PO TABS
25.0000 mg | ORAL_TABLET | Freq: Every day | ORAL | Status: DC
  Administered 2015-06-23 – 2015-06-24 (×2): 25 mg via ORAL
  Filled 2015-06-23 (×2): qty 1

## 2015-06-23 MED ORDER — CALCIUM CARBONATE ANTACID 500 MG PO CHEW
1.0000 | CHEWABLE_TABLET | Freq: Three times a day (TID) | ORAL | Status: DC | PRN
  Administered 2015-06-23: 200 mg via ORAL
  Filled 2015-06-23: qty 1

## 2015-06-23 MED ORDER — SENNOSIDES-DOCUSATE SODIUM 8.6-50 MG PO TABS
2.0000 | ORAL_TABLET | Freq: Once | ORAL | Status: AC
  Administered 2015-06-24: 2 via ORAL
  Filled 2015-06-23: qty 2

## 2015-06-23 MED ORDER — POTASSIUM CHLORIDE CRYS ER 20 MEQ PO TBCR
40.0000 meq | EXTENDED_RELEASE_TABLET | Freq: Once | ORAL | Status: AC
  Administered 2015-06-23: 40 meq via ORAL
  Filled 2015-06-23: qty 2

## 2015-06-23 MED ORDER — CALCIUM CARBONATE 1250 (500 CA) MG PO TABS
1.0000 | ORAL_TABLET | Freq: Every day | ORAL | Status: DC
  Administered 2015-06-23 – 2015-06-24 (×2): 500 mg via ORAL
  Filled 2015-06-23: qty 1

## 2015-06-23 NOTE — Progress Notes (Signed)
Nursing Note: Pt declines pneumonia shot.She says she does not take Flu shot either.Pt educated on importance,icidence of deaths due to pnuemonia this season,risks and advantages.Pt says she wants to get well first and then she will think about.I suggested she discuss it with her Doctor.wbb

## 2015-06-23 NOTE — Progress Notes (Signed)
Nursing Note: Pt arrived via w/c.Pt able to stand and walk to the bed.Alert and Oriented x4.Denies pain.Pt oriented to room,use of call bell and of safety plan.Pt given yellow socks and explained purpose.Pt has friend that also accompanied her to the unit.wbb

## 2015-06-23 NOTE — Progress Notes (Signed)
Karina NOTE    HENRETTA QUIST  ZOX:[id number] DOB: 1950-09-23 DOA: 06/22/2015 [name]: Karina [name]   Brief Narrative:  Karina [name] is a 65 y.o. female [name] h/o HTN urgency admission to our [name], Karina [name], Karina [name], Karina [name], Karina [name] Na of 116 and sent to ER Assessment & Plan:   Principal Problem:   Acute hyponatremia -due to dehydration and HCTZ -over-corrected and Na 132 today, will stop IVF -stopped Maxzide    HTN (hypertension) -continue amlodipine, add cozaar   DVT prophylaxis: lovenox Code Status: Full Code Family Communication: daughter at bedside Disposition Plan: Home tomorrow   Consultants:     Procedures:   Subjective: Feels better, was dizzy earlier  Objective: Filed Vitals:   06/22/15 2100 06/22/15 2212 06/23/15 0833 06/23/15 0840  BP: 145/66 180/63 143/67 143/67  Pulse: 84 94 90   Temp:  98.4 F (36.9 C) 97.5 F (36.4 C)   TempSrc:  Oral Oral   Resp: Height:  5' (Karina.524 m)    Weight:  57.788 kg (127 lb 6.4 oz)    SpO2: 97% 100%      Intake/Output Summary (Last 24 hours) at 06/23/15 1049 Last data filed at 06/23/15 0843  Gross per 24 hour  Intake   1750 ml  Output   3875 ml  Net  -2125 ml   Filed Weights   06/22/15 2212  Weight: 57.788 kg (127 lb 6.4 oz)    Examination:  General exam: Appears calm and comfortable , no distress Respiratory system: Clear to auscultation. Respiratory effort normal. Cardiovascular system: S1 & S2 heard, RRR. No JVD, murmurs, rubs, gallops or clicks. No pedal edema. Gastrointestinal system: Abdomen is nondistended, soft and nontender. No organomegaly or masses felt. Normal bowel sounds heard. Central nervous system: Alert and oriented. No focal neurological deficits. Extremities: Symmetric 5 x 5 power. Skin: No rashes, lesions or ulcers Psychiatry: Judgement and insight appear normal.  Mood & affect appropriate.     Data Reviewed: I have personally reviewed following labs and imaging studies  CBC:  Recent Labs Karina 06/18/15 2303 06/19/15 0525 06/22/15 1929 06/23/15 0354  WBC 6.Karina 4.9 6.8 5.7  NEUTROABS 3.4  --  4.8  --   HGB 12.7 11.8* 13.5 12.6  HCT 36.8 34.3* 36.0 34.2*  MCV 88.0 87.5 82.6 83.6  PLT 252 253 285 277   Basic Metabolic Panel:  Recent Labs Karina 06/18/15 2303 06/19/15 0525 06/20/15 0438 06/22/15 1929 06/23/15 0354  NA 132* 132* 130* 114* 132*  K 3.7 3.4* 4.8 3.5 3.0*  CL 99* 97* 99* 79* 99*  CO2 GLUCOSE 117* 97 98 114* 98  BUN 14 12 23* 17 13  CREATININE 0.78 0.77 Karina.15* 0.94 0.74  CALCIUM 9.3 9.3 9.Karina 9.6 8.8*   GFR: Estimated Creatinine Clearance: 55 mL/min (by C-G formula based on Cr of 0.74). Liver Function Tests: No results for input(s): AST, ALT, ALKPHOS, BILITOT, PROT, ALBUMIN in the last 168 hours. No results for input(s): LIPASE, AMYLASE in the last 168 hours. No results for input(s): AMMONIA in the last 168 hours. Coagulation Profile: No results for input(s): INR, PROTIME in the last 168 hours. Cardiac Enzymes:  Recent Labs Karina 06/18/15 2303 06/19/15 0525 06/19/15 1153 06/19/15 1813  TROPONINI 0.04* 0.04* 0.03 0.03   BNP (last 3 results) No results for input(s): PROBNP  in the last 8760 hours. HbA1C: No results for input(s): HGBA1C in the last 72 hours. CBG: No results for input(s): GLUCAP in the last 168 hours. Lipid Profile: No results for input(s): CHOL, HDL, LDLCALC, TRIG, CHOLHDL, LDLDIRECT in the last 72 hours. Thyroid Function Tests: No results for input(s): TSH, T4TOTAL, FREET4, T3FREE, THYROIDAB in the last 72 hours. Anemia Panel: No results for input(s): VITAMINB12, FOLATE, FERRITIN, TIBC, IRON, RETICCTPCT in the last 72 hours. Urine analysis: No results found for: COLORURINE, APPEARANCEUR, LABSPEC, PHURINE, GLUCOSEU, HGBUR, BILIRUBINUR, KETONESUR, PROTEINUR, UROBILINOGEN, NITRITE,  LEUKOCYTESUR Sepsis Labs: @LABRCNTIP (procalcitonin:4,lacticidven:4)  )No results found for this or any previous visit (from the past 240 hour(s)).       Radiology Studies: No results found.      Scheduled Meds: . amLODipine  5 mg Oral BID  . antiseptic oral rinse  7 mL Mouth Rinse q12n4p  . aspirin EC  81 mg Oral Daily  . calcium carbonate  600 mg of elemental calcium Oral Daily  . chlorhexidine  15 mL Mouth Rinse BID  . enoxaparin (LOVENOX) injection  40 mg Subcutaneous Q24H  . feeding supplement (ENSURE ENLIVE)  237 mL Oral BID BM  . levothyroxine  75 mcg Oral QAC breakfast  . losartan  25 mg Oral Daily  . pravastatin  20 mg Oral Daily  . sodium chloride  Karina,000 mL Intravenous Once   Continuous Infusions:    LOS: Karina day    Time spent: 35min    Zannie CovePreetha Analissa Bayless, MD [facility] Pager [phone]  If 7PM-7AM, please contact night-coverage [email or web address] Password [email or web address] 06/23/2015, 10:49 AM

## 2015-06-24 LAB — BASIC METABOLIC PANEL WITH GFR
Anion gap: 8 (ref 5–15)
BUN: 13 mg/dL (ref 6–20)
CO2: 26 mmol/L (ref 22–32)
Calcium: 8.9 mg/dL (ref 8.9–10.3)
Chloride: 100 mmol/L — ABNORMAL LOW (ref 101–111)
Creatinine, Ser: 0.91 mg/dL (ref 0.44–1.00)
GFR calc Af Amer: >60 mL/min
GFR calc non Af Amer: >60 mL/min
Glucose, Bld: 96 mg/dL (ref 65–99)
Potassium: 4.1 mmol/L (ref 3.5–5.1)
Sodium: 134 mmol/L — ABNORMAL LOW (ref 135–145)

## 2015-06-24 MED ORDER — LOSARTAN POTASSIUM 25 MG PO TABS: 25.0000 mg | ORAL_TABLET | Freq: Every day | ORAL | Status: DC

## 2015-06-24 NOTE — Progress Notes (Signed)
Pt discharged home in stable condition. Discharge instructions given. Pt verbalized understanding.  

## 2015-06-26 NOTE — Discharge Summary (Signed)
Physician Discharge Summary  Karina SiJulie L Whipple VQQ:[id number] DOB: 03-Mar-1951 DOA: 06/22/2015  PCP: Ethel RanaHEPLER,MARK, PA-C  Admit date: 06/22/2015 Discharge date: 06/26/2015  Time spent: 45 minutes  Recommendations for Outpatient Follow-up:  1. PCP Mark Helper PAC in 1 week, please check Bmet at [facility]   Discharge Diagnoses:  Principal Problem:   Acute hyponatremia Active Problems:   Hypochloremia   HTN (hypertension)   Discharge Condition: stable  Diet recommendation: heart healthy, low sodium  Filed Weights   06/22/15 2212  Weight: 57.788 kg (127 lb 6.4 oz)    History of present illness:  Karina [name] is a 65 y.o. female with h/o HTN urgency admission to our service, just discharged 2 days ago with new med Maxide. Since discharge she developed dizziness, 1 episode of vomiting, went to her PCP, lab with Na of 116 and sent to ER  Hospital Course:  Acute hyponatremia -due to dehydration and HCTZ -corrected with hydration and stopping Maxzide -improved symptomatically and level was 134 at discharge today   HTN (hypertension) -continue amlodipine, started on low dose cozaar 25mg  instead of Maxzide   Discharge Exam: Filed Vitals:   06/24/15 0109 06/24/15 0559  BP: 172/64 126/66  Pulse: 72 66  Temp: 97.9 F (36.6 C) 98.1 F (36.7 C)  Resp: 16 16    General: AAOx3 Cardiovascular: S1S2/RRR Respiratory: CTAB  Discharge Instructions   Discharge Instructions    Diet - low sodium heart healthy    Complete by:  As directed      Increase activity slowly    Complete by:  As directed           Discharge Medication List as of 06/24/2015 10:56 AM    START taking these medications   Details  losartan (COZAAR) 25 MG tablet Take 1 tablet (25 mg total) by mouth daily., Starting 06/24/2015, Until Discontinued, Print      CONTINUE these medications which have NOT CHANGED   Details  amLODipine (NORVASC) 5 MG tablet Take 1 tablet (5 mg total) by mouth 2 (two) times daily.,  Starting 06/20/2015, Until Discontinued, Print    aspirin EC 81 MG tablet Take 1 tablet (81 mg total) by mouth daily., Starting 06/20/2015, Until Discontinued, Print    calcium carbonate (OSCAL) 1500 (600 Ca) MG TABS tablet Take 600 mg of elemental calcium by mouth daily., Until Discontinued, Historical Med    feeding supplement, ENSURE ENLIVE, (ENSURE ENLIVE) LIQD Take 237 mLs by mouth 2 (two) times daily between meals., Starting 06/20/2015, Until Discontinued, Print    levothyroxine (SYNTHROID, LEVOTHROID) 75 MCG tablet Take 75 mcg by mouth daily., Starting 06/01/2015, Until Discontinued, Historical Med    pravastatin (PRAVACHOL) 20 MG tablet Take 20 mg by mouth daily. Reported on 06/18/2015, Starting 02/25/2015, Until Discontinued, Historical Med    sertraline (ZOLOFT) 50 MG tablet Take 50 mg by mouth daily. 1/2 tablet x7 days then increase to 1 tablet at night., Until Discontinued, Historical Med       Allergies  Allergen Reactions  . Maxzide [Hydrochlorothiazide W-Triamterene] Other (See Comments)    Severe hyponatremia with med.   Follow-up Information    Follow up with HEPLER,MARK, PA-C In 1 week.   Specialty:  Physician Assistant   Why:  please [facility]   Contact information:   [address] [place] [address] [phone]        The results of significant diagnostics from this hospitalization (including imaging, microbiology, ancillary and laboratory) are listed  below for reference.    Significant Diagnostic Studies: Dg Chest 2 View  06/19/2015  CLINICAL DATA:  65 year old female with high blood pressure. EXAM: CHEST  2 VIEW COMPARISON:  No priors. FINDINGS: Lung volumes are normal. No consolidative airspace disease. No pleural effusions. No pneumothorax. No pulmonary nodule or mass noted. Pulmonary vasculature and the cardiomediastinal silhouette are within normal limits. Atherosclerosis in the thoracic aorta. IMPRESSION: 1.  No radiographic evidence of  acute cardiopulmonary disease. 2. Atherosclerosis. Electronically Signed   By: Trudie Reed M.D.   On: 06/19/2015 11:54   Nm Myocar Multi W/spect W/wall Motion / Ef  06/20/2015  CLINICAL DATA:  Chest pain and hypertension EXAM: MYOCARDIAL IMAGING WITH SPECT (REST AND PHARMACOLOGIC-STRESS) GATED LEFT VENTRICULAR WALL MOTION STUDY LEFT VENTRICULAR EJECTION FRACTION TECHNIQUE: Standard myocardial SPECT imaging was performed after resting intravenous injection of 10 mCi Tc-45m sestamibi. Subsequently, intravenous infusion of Lexiscan was performed under the supervision of the Cardiology staff. At peak effect of the drug, 30 mCi Tc-74m sestamibi was injected intravenously and standard myocardial SPECT imaging was performed. Quantitative gated imaging was also performed to evaluate left ventricular wall motion, and estimate left ventricular ejection fraction. COMPARISON:  None. FINDINGS: Perfusion: Allowing for inferior attenuation artifact, No significant decreased activity in the left ventricle on stress imaging to suggest reversible ischemia or infarction. Wall Motion: Normal left ventricular wall motion. No left ventricular dilation. Left Ventricular Ejection Fraction: 66 % End diastolic volume 37 ml End systolic volume 13 ml IMPRESSION: 1. No reversible ischemia or infarction. 2. Normal left ventricular wall motion. 3. Left ventricular ejection fraction 66% 4. Low-risk stress test findings*. *2012 Appropriate Use Criteria for Coronary Revascularization Focused Update: J Am Coll Cardiol. 2012;59(9):857-881. http://content.dementiazones.com.aspx?articleid=1201161 Electronically Signed   By: Judie Petit.  Shick M.D.   On: 06/20/2015 16:18    Microbiology: No results found for this or any previous visit (from the past 240 hour(s)).   Labs: Basic Metabolic Panel:  Recent Labs Lab 06/20/15 0438 06/22/15 1929 06/23/15 0354 06/24/15 0737  NA 130* 114* 132* 134*  K 4.8 3.5 3.0* 4.1  CL 99* 79* 99* 100*   CO2 GLUCOSE 98 114* 98 96  BUN 23* CREATININE 1.15* 0.94 0.74 0.91  CALCIUM 9.1 9.6 8.8* 8.9   Liver Function Tests: No results for input(s): AST, ALT, ALKPHOS, BILITOT, PROT, ALBUMIN in the last 168 hours. No results for input(s): LIPASE, AMYLASE in the last 168 hours. No results for input(s): AMMONIA in the last 168 hours. CBC:  Recent Labs Lab 06/22/15 1929 06/23/15 0354  WBC 6.8 5.7  NEUTROABS 4.8  --   HGB 13.5 12.6  HCT 36.0 34.2*  MCV 82.6 83.6  PLT 285 277   Cardiac Enzymes:  Recent Labs Lab 06/19/15 1813  TROPONINI 0.03   BNP: BNP (last 3 results) No results for input(s): BNP in the last 8760 hours.  ProBNP (last 3 results) No results for input(s): PROBNP in the last 8760 hours.  CBG: No results for input(s): GLUCAP in the last 168 hours.     SignedZannie Cove MD.  [facility] 06/26/2015, 5:26 PM

## 2015-06-27 ENCOUNTER — Ambulatory Visit (INDEPENDENT_AMBULATORY_CARE_PROVIDER_SITE_OTHER): Payer: PPO | Admitting: Physician Assistant

## 2015-06-27 ENCOUNTER — Encounter: Payer: Self-pay | Admitting: Physician Assistant

## 2015-06-27 ENCOUNTER — Telehealth: Payer: Self-pay | Admitting: *Deleted

## 2015-06-27 VITALS — BP 140/70 | HR 76 | Ht 60.0 in | Wt 117.4 lb

## 2015-06-27 DIAGNOSIS — R0789 Other chest pain: Secondary | ICD-10-CM

## 2015-06-27 DIAGNOSIS — E785 Hyperlipidemia, unspecified: Secondary | ICD-10-CM | POA: Diagnosis not present

## 2015-06-27 DIAGNOSIS — E871 Hypo-osmolality and hyponatremia: Secondary | ICD-10-CM

## 2015-06-27 DIAGNOSIS — I119 Hypertensive heart disease without heart failure: Secondary | ICD-10-CM | POA: Diagnosis not present

## 2015-06-27 LAB — BASIC METABOLIC PANEL
BUN: 11 mg/dL (ref 7–25)
CO2: 26 mmol/L (ref 20–31)
Calcium: 9.2 mg/dL (ref 8.6–10.4)
Chloride: 100 mmol/L (ref 98–110)
Creat: 0.7 mg/dL (ref 0.50–0.99)
Glucose, Bld: 95 mg/dL (ref 65–99)
Potassium: 4.2 mmol/L (ref 3.5–5.3)
Sodium: 134 mmol/L — ABNORMAL LOW (ref 135–146)

## 2015-06-27 NOTE — Telephone Encounter (Signed)
Pt notified of lab results by phone with verbal understanding. I will fax results to PCP.  

## 2015-06-27 NOTE — Progress Notes (Signed)
Cardiology Office Note:    Date:  06/27/2015   ID:  TECLA MAILLOUX, DOB 1950-07-30, MRN [id number]  PCP:  Ethel Rana  Cardiologist:  Dr. Donato Schultz   Electrophysiologist:  n/a  Referring MD: Lovenia Kim, PA-C   Chief Complaint  Patient presents with  . Hospitalization Follow-up    admx with chest pain    History of Present Illness:     Karina [name] is a 65 y.o. female with a hx of Hyperlipidemia, HTN, hypothyroidism and abnormal EKG. She was evaluated by Dr. Anne Fu in 1/17 for an ECG that demonstrated possible old septal infarct. Echocardiogram demonstrated normal LV function and normal wall motion.   Admitted 4/15-4/17 with chest pain and hypertensive emergency. Lisinopril had previously been discontinued secondary to side effects. She was placed on Maxide, amlodipine and metoprolol. Troponin levels were minimally elevated (0.04) She was seen by cardiology. Inpatient nuclear stress test was low risk with no evidence of ischemia or infarction.   Admitted again 4/19-4/23 with symptomatic hyponatremia. She presented with dizziness and vomiting. Sodium was 116. Maxide was stopped. She was hydrated and sodium at discharge was 134. She was placed on Losartan.    Returns for FU.  Since DC, she has been doing well. She notes significant issues with anxiety which causes some chest symptoms. She also notes frequent belching. She denies syncope. She denies significant dyspnea. She denies orthopnea, PND or edema.   Past Medical History  Diagnosis Date  . Hearing loss   . Hypothyroidism     "thyroid problem" unable to explain  . Hypertension   . HLD (hyperlipidemia)   . History of echocardiogram     Echo 1/17 - EF 60-65%, normal wall motion, grade 1 diastolic dysfunction, LA upper limits of normal, moderate TR, PASP 41 mmHg  . History of nuclear stress test     Myoview 4/17 - no ischemia, EF 66%    Past Surgical History  Procedure Laterality Date  . Tonsillectomy       Current Medications: Outpatient Prescriptions Prior to Visit  Medication Sig Dispense Refill  . amLODipine (NORVASC) 5 MG tablet Take 1 tablet (5 mg total) by mouth 2 (two) times daily. 60 tablet 0  . aspirin EC 81 MG tablet Take 1 tablet (81 mg total) by mouth daily. 30 tablet 0  . calcium carbonate (OSCAL) 1500 (600 Ca) MG TABS tablet Take 600 mg of elemental calcium by mouth daily.    . feeding supplement, ENSURE ENLIVE, (ENSURE ENLIVE) LIQD Take 237 mLs by mouth 2 (two) times daily between meals. 237 mL 12  . levothyroxine (SYNTHROID, LEVOTHROID) 75 MCG tablet Take 75 mcg by mouth daily.  0  . losartan (COZAAR) 25 MG tablet Take 1 tablet (25 mg total) by mouth daily. 30 tablet 1  . pravastatin (PRAVACHOL) 20 MG tablet Take 20 mg by mouth daily. Reported on 06/18/2015  1  . sertraline (ZOLOFT) 50 MG tablet Take 50 mg by mouth daily. 1/2 tablet x7 days then increase to 1 tablet at night.     No facility-administered medications prior to visit.     Allergies:   Maxzide   Social History   Social History  . Marital Status: Divorced    Spouse Name: N/A  . Number of Children: N/A  . Years of Education: N/A   Social History Main Topics  . Smoking status: Former Smoker    Quit date: 01/23/2015  . Smokeless tobacco: None  . Alcohol Use: No  .  Drug Use: No  . Sexual Activity: No   Other Topics Concern  . None   Social History Narrative     Family History:  The patient's family history includes Diabetes in her mother; Hypertension in her mother; Stroke in her mother.   ROS:   Please see the history of present illness.    Review of Systems  Constitution: Positive for weight loss.  HENT: Positive for hearing loss.   Cardiovascular: Positive for irregular heartbeat.  Respiratory: Positive for cough and snoring.   Hematologic/Lymphatic: Bruises/bleeds easily.  Musculoskeletal: Positive for joint pain and joint swelling.  Psychiatric/Behavioral: Positive for depression. The  patient is nervous/anxious.    All other systems reviewed and are negative.   Physical Exam:    VS:  BP 140/70 mmHg  Pulse 76  Ht 5' (1.524 m)  Wt 117 lb 6.4 oz (53.252 kg)  BMI 22.93 kg/m2   GEN: Well nourished, well developed, in no acute distress HEENT: normal Neck: no JVD, no masses Cardiac: Normal S1/S2,  RRR; no murmurs, rubs, or gallops, no edema;     Respiratory:  clear to auscultation bilaterally; no wheezing, rhonchi or rales GI: soft, nontender, nondistended MS: no deformity or atrophy Skin: warm and dry Neuro: No focal deficits  Psych: Alert and oriented x 3, normal affect  Wt Readings from Last 3 Encounters:  06/27/15 117 lb 6.4 oz (53.252 kg)  06/22/15 127 lb 6.4 oz (57.788 kg)  06/20/15 117 lb 15.1 oz (53.5 kg)      Studies/Labs Reviewed:     EKG:  EKG is  ordered today.  The ekg ordered today demonstrates NSR, HR 76, normal axis, PVCs, nonspecific ST-T wave changes, QTc 477 ms  Recent Labs: 06/19/2015: TSH 2.982 06/23/2015: Hemoglobin 12.6; Platelets 277 06/24/2015: BUN 13; Creatinine, Ser 0.91; Potassium 4.1; Sodium 134*   Recent Lipid Panel No results found for: CHOL, TRIG, HDL, CHOLHDL, VLDL, LDLCALC, LDLDIRECT  Additional studies/ records that were reviewed today include:   Myoview 4/617/17 IMPRESSION: 1. No reversible ischemia or infarction. 2. Normal left ventricular wall motion. 3. Left ventricular ejection fraction 66% 4. Low-risk stress test findings*.  Echo 04/04/15 EF 60-65%, normal wall motion, grade 1 diastolic dysfunction, LA upper limits of normal, moderate TR, PASP 41 mmHg   ASSESSMENT:     1. Other chest pain   2. Hypertensive heart disease without heart failure   3. Hyperlipemia   4. Hyponatremia     PLAN:     In order of problems listed above:  1. Chest pain - Atypical. A lot of her symptoms seem to be related to anxiety. Question if she may have acid reflux as well. Recent stress testing was low risk. No further cardiac  testing at this time. I have recommended that she follow-up with her PCP to discuss further treatment of anxiety. I have also asked her to try over-the-counter antacids to see if this helps. If it does improve her symptoms, she can discuss this further with her PCP.  2. HTN - Better controlled. She notes higher readings at home. However, she uses a wrist cuff. I have asked her to check her blood pressure again today. If it is much different from the reading we obtained, she should look into obtaining an arm cuff.  3. HL - Managed by primary care. Continue statin.  4. Hyponatremia - This was in the setting of diuretic therapy. Avoid hydrochlorothiazide in the future. She does have frequent PVCs on her ECG. Repeat BMET  today.   Medication Adjustments/Labs and Tests Ordered: Current medicines are reviewed at length with the patient today.  Concerns regarding medicines are outlined above.  Medication changes, Labs and Tests ordered today are outlined in the Patient Instructions noted below. Patient Instructions  Medication Instructions:  Your physician recommends that you continue on your current medications as directed. Please refer to the Current Medication list given to you today. Labwork: TODAY BMET Testing/Procedures: NONE Follow-Up: Your physician wants you to follow-up in: 6 MONTHS WITH DR. Anne Fu You will receive a reminder letter in the mail two months in advance. If you don't receive a letter, please call our office to schedule the follow-up appointment. Any Other Special Instructions Will Be Listed Below (If Applicable). If you need a refill on your cardiac medications before your next appointment, please call your pharmacy.     Signed, Tereso Newcomer, PA-C  06/27/2015 10:24 AM    [facility] [address] [place], [place], [place]  [address] Phone: [phone]; Fax: [phone]

## 2015-06-27 NOTE — Patient Instructions (Addendum)
Medication Instructions:  Your physician recommends that you continue on your current medications as directed. Please refer to the Current Medication list given to you today. Labwork: TODAY BMET Testing/Procedures: NONE Follow-Up: Your physician wants you to follow-up in: 6 MONTHS WITH DR. Anne FuSKAINS You will receive a reminder letter in the mail two months in advance. If you don't receive a letter, please call our office to schedule the follow-up appointment. Any Other Special Instructions Will Be Listed Below (If Applicable). If you need a refill on your cardiac medications before your next appointment, please call your pharmacy.

## 2015-06-28 ENCOUNTER — Telehealth: Payer: Self-pay | Admitting: Diagnostic Radiology

## 2015-06-28 ENCOUNTER — Telehealth: Payer: Self-pay | Admitting: Physician Assistant

## 2015-06-28 NOTE — Telephone Encounter (Signed)
New Message:  Pt is returning your call from this morning about some lab results. Please f/u with her

## 2015-06-28 NOTE — Telephone Encounter (Signed)
ERROR WRONG PROVIDER.

## 2015-06-28 NOTE — Telephone Encounter (Signed)
I called pt to let her know that there was nothing wrong, I called her home this morning in error. We went over lab results yesterday. I explained I was meaning to call PCP to let them know I was sending lab results. Pt said ok and thank you

## 2015-06-30 DIAGNOSIS — I10 Essential (primary) hypertension: Secondary | ICD-10-CM | POA: Diagnosis not present

## 2015-06-30 DIAGNOSIS — E039 Hypothyroidism, unspecified: Secondary | ICD-10-CM | POA: Diagnosis not present

## 2015-09-29 DIAGNOSIS — E785 Hyperlipidemia, unspecified: Secondary | ICD-10-CM | POA: Diagnosis not present

## 2015-09-29 DIAGNOSIS — I10 Essential (primary) hypertension: Secondary | ICD-10-CM | POA: Diagnosis not present

## 2015-09-29 DIAGNOSIS — E039 Hypothyroidism, unspecified: Secondary | ICD-10-CM | POA: Diagnosis not present

## 2015-11-03 DIAGNOSIS — I10 Essential (primary) hypertension: Secondary | ICD-10-CM | POA: Diagnosis not present

## 2016-02-14 ENCOUNTER — Encounter: Payer: Self-pay | Admitting: Family Medicine

## 2016-02-14 ENCOUNTER — Ambulatory Visit (INDEPENDENT_AMBULATORY_CARE_PROVIDER_SITE_OTHER): Payer: PPO | Admitting: Family Medicine

## 2016-02-14 VITALS — BP 160/80 | HR 96 | Resp 12 | Ht 60.0 in | Wt 130.5 lb

## 2016-02-14 DIAGNOSIS — I119 Hypertensive heart disease without heart failure: Secondary | ICD-10-CM

## 2016-02-14 DIAGNOSIS — Z0000 Encounter for general adult medical examination without abnormal findings: Secondary | ICD-10-CM | POA: Diagnosis not present

## 2016-02-14 DIAGNOSIS — E871 Hypo-osmolality and hyponatremia: Secondary | ICD-10-CM

## 2016-02-14 LAB — COMPREHENSIVE METABOLIC PANEL
ALBUMIN: 4.3 g/dL (ref 3.5–5.2)
ALT: 11 U/L (ref 0–35)
AST: 18 U/L (ref 0–37)
Alkaline Phosphatase: 99 U/L (ref 39–117)
BILIRUBIN TOTAL: 0.3 mg/dL (ref 0.2–1.2)
BUN: 22 mg/dL (ref 6–23)
CHLORIDE: 104 meq/L (ref 96–112)
CO2: 30 meq/L (ref 19–32)
CREATININE: 0.98 mg/dL (ref 0.40–1.20)
Calcium: 9 mg/dL (ref 8.4–10.5)
GFR: 60.2 mL/min (ref >60.00)
Glucose, Bld: 131 mg/dL — ABNORMAL HIGH (ref 70–99)
Potassium: 4.6 mEq/L (ref 3.5–5.1)
SODIUM: 141 meq/L (ref 135–145)
Total Protein: 7.2 g/dL (ref 6.0–8.3)

## 2016-02-14 NOTE — Progress Notes (Signed)
Pre visit review using our clinic review tool, if applicable. No additional management support is needed unless otherwise documented below in the visit note. 

## 2016-02-14 NOTE — Patient Instructions (Addendum)
A few things to remember from today's visit:   Hyponatremia - Plan: Comprehensive metabolic panel  Hypertensive heart disease without heart failure - Plan: Comprehensive metabolic panel  Blood pressure goal for most people is less than 140/90.  Elevated blood pressure increases the risk of strokes, heart and kidney disease, and eye problems. Regular physical activity and a healthy diet (DASH diet) usually help. Low salt diet. Take medications as instructed. Caution with some over the counter medications as cold medications, dietary products (for weight loss), and Ibuprofen or Aleve (frequent use);all these medications could cause elevation of blood pressure.   Resume Amlodipine, no changes in Cozaar.  Eye exam is due.  Caution with over drinking fluids.    Please be sure medication list is accurate. If a new problem present, please set up appointment sooner than planned today.

## 2016-02-14 NOTE — Progress Notes (Signed)
HPI:   [name] is a 65 y.o. female, who is here today to establish care with me.  Former PCP: Mr Karina [name] at [facility], [place] Last preventive routine visit: 2017.  Concerns today: HTN.   Hypertension:  Dx about a year. She is on Amlodipine 5 mg and Losartan 25 mg daily.   She has not taken Amlodipine 5 mg, denies side effects from medication but she was not sure if she was supposed to take Karina.  BP at home, "up and down."  She has not noted unusual headache, visual changes, exertional chest pain, dyspnea,  focal weakness, or edema.  She denies Hx of CKD III, reporting foamy urine, denies gross hematuria.  Lab Results  Component Value Date   CREATININE 0.70 06/27/2015   BUN 11 06/27/2015   NA 134 (L) 06/27/2015   K 4.2 06/27/2015   CL 100 06/27/2015   CO2 26 06/27/2015   Has had intermittent hypoNa++, denies alcohol abuse but states that she drinks "a lot of " water.  She has not had colonoscopy, states that she was given ICT  and has not done Karina.  She has not had vaccinations and not interested in having any today.Marland Kitchen.  Hx of HLD on Pravastatin 20 mg daily and hypothyroidism on Levothyroxine 75 mcg daily.  She lives alone. Independent ADL's and IADL's.     Review of Systems  Constitutional: Negative for activity change, appetite change, fatigue, fever and unexpected weight change.  HENT: Negative for mouth sores, nosebleeds and trouble swallowing.   Eyes: Negative for redness and visual disturbance.  Respiratory: Negative for cough, shortness of breath and wheezing.   Cardiovascular: Negative for chest pain, palpitations and leg swelling.  Gastrointestinal: Negative for abdominal pain, nausea and vomiting.       Negative for changes in bowel habits.  Genitourinary: Negative for decreased urine volume, difficulty urinating, dysuria and hematuria.  Musculoskeletal: Positive for arthralgias (Hx of OA).  Neurological: Negative for seizures,  syncope, weakness, numbness and headaches.  Psychiatric/Behavioral: Negative.       Current Outpatient Prescriptions on File Prior to Visit  Medication Sig Dispense Refill  . amLODipine (NORVASC) 5 MG tablet Take 1 tablet (5 mg total) by mouth 2 (two) times daily. 60 tablet 0  . calcium carbonate (OSCAL) 1500 (600 Ca) MG TABS tablet Take 600 mg of elemental calcium by mouth daily.    Marland Kitchen. levothyroxine (SYNTHROID, LEVOTHROID) 75 MCG tablet Take 75 mcg by mouth daily.  0  . losartan (COZAAR) 25 MG tablet Take 1 tablet (25 mg total) by mouth daily. 30 tablet 1  . pravastatin (PRAVACHOL) 20 MG tablet Take 20 mg by mouth daily. Reported on 06/18/2015  1   No current facility-administered medications on file prior to visit.      Past Medical History:  Diagnosis Date  . Hearing loss   . History of echocardiogram    Echo 1/17 - EF 60-65%, normal wall motion, grade 1 diastolic dysfunction, LA upper limits of normal, moderate TR, PASP 41 mmHg  . History of nuclear stress test    Myoview 4/17 - no ischemia, EF 66%  . HLD (hyperlipidemia)   . Hypertension   . Hypothyroidism    "thyroid problem" unable to explain   Allergies  Allergen Reactions  . Maxzide [Hydrochlorothiazide W-Triamterene] Other (See Comments)    Severe hyponatremia with med.    Family History  Problem Relation Age of Onset  . Stroke Mother   .  Diabetes Mother   . Hypertension Mother     Social History   Social History  . Marital status: Divorced    Spouse name: N/A  . Number of children: N/A  . Years of education: N/A   Social History Main Topics  . Smoking status: Former Smoker    Quit date: 01/23/2015  . Smokeless tobacco: None  . Alcohol use No  . Drug use: No  . Sexual activity: No   Other Topics Concern  . None   Social History Narrative  . None    Vitals:   02/14/16 1358  BP: (!) 160/80  Pulse: 96  Resp: 12    Body mass index is 25.49 kg/m.   Physical Exam  Nursing note and vitals  reviewed. Constitutional: She is oriented to person, place, and time. She appears well-developed and well-nourished. No distress.  HENT:  Head: Atraumatic.  Mouth/Throat: Oropharynx is clear and moist and mucous membranes are normal. Abnormal dentition (poor).  Missing teeth.  Eyes: Conjunctivae and EOM are normal. Pupils are equal, round, and reactive to light.  Neck: No tracheal deviation present. No thyroid mass and no thyromegaly present.  Cardiovascular: Normal rate and regular rhythm.   Murmur (soft RUSB SEM) heard. Pulses:      Dorsalis pedis pulses are 2+ on the right side, and 2+ on the left side.  Respiratory: Effort normal and breath sounds normal. No respiratory distress.  GI: Soft. She exhibits no mass. There is no hepatomegaly. There is no tenderness.  Musculoskeletal: She exhibits no edema or tenderness.  Neurological: She is alert and oriented to person, place, and time. She has normal strength. Coordination normal.  Skin: Skin is warm. No erythema.  Psychiatric: Her mood appears anxious.  Well groomed, good eye contact.      ASSESSMENT AND PLAN:     Karina [name] was seen today for establish care.  Diagnoses and all orders for this visit:    Hyponatremia  Further recommendations will be given according to lab results. Instructed to stay adequately hydrated but avoid over drinking fluids, this could aggravate problem. Some side effects of Losartan discussed.  -     Comprehensive metabolic panel -     Urine Microalbumin w/creat. ratio  Hypertensive heart disease without heart failure  Not well controlled. Resume Amlodipine, no changes in Cozaar. BP check in 4 weeks. Possible complications of elevated BP discussed. Annual eye examination. F/U in 4 months.  -     Comprehensive metabolic panel -     Urine Microalbumin w/creat. ratio    Health care maintenance  She is not interested in vaccination or colonoscopy for now.      Karina [name] G. Swaz[place],  MD  [facility]. [place] office.

## 2016-02-15 ENCOUNTER — Encounter: Payer: Self-pay | Admitting: Family Medicine

## 2016-02-15 LAB — MICROALBUMIN / CREATININE URINE RATIO
CREATININE, U: 172.9 mg/dL
Microalb Creat Ratio: 5 mg/g (ref 0.0–30.0)
Microalb, Ur: 8.6 mg/dL — ABNORMAL HIGH (ref 0.0–1.9)

## 2016-02-17 ENCOUNTER — Telehealth: Payer: Self-pay | Admitting: Family Medicine

## 2016-02-17 NOTE — Telephone Encounter (Signed)
Informed patient of results and patient verbalized understanding.  

## 2016-02-17 NOTE — Telephone Encounter (Signed)
Pt calling wanting lab results

## 2016-06-14 ENCOUNTER — Telehealth: Payer: Self-pay | Admitting: Family Medicine

## 2016-06-14 NOTE — Telephone Encounter (Signed)
Labs placed in envelope to be mailed to patient.   Patient called on 02/17/16 & lab results were given to her.

## 2016-06-14 NOTE — Telephone Encounter (Signed)
Pt states she never heard back from her labs done 02/2016 and would like a copy of labs sent to her home.

## 2016-11-22 ENCOUNTER — Encounter: Payer: Self-pay | Admitting: Family Medicine

## 2017-01-17 DIAGNOSIS — E785 Hyperlipidemia, unspecified: Secondary | ICD-10-CM | POA: Insufficient documentation

## 2017-01-17 NOTE — Progress Notes (Signed)
HPI:   [name] is a 66 y.o. female, who is here today for her routine physical. She also has Medicare, last preventive AWV 02/22/15.  Last CPE: 02/2016  Regular exercise 3 or more time per week: She walks several times per week. Following a healthy diet: Yes. She lives alone.    Chronic medical problems: HTN,HLD,hearing loss,anxiety,hypothyroidism, and OA among some.   Independent ADL's and IADL's. No falls in the past year and denies depression symptoms.  Functional Status Survey: Is the patient deaf or have difficulty hearing?: Yes Does the patient have difficulty seeing, even when wearing glasses/contacts?: No Does the patient have difficulty concentrating, remembering, or making decisions?: No Does the patient have difficulty walking or climbing stairs?: No Does the patient have difficulty dressing or bathing?: No Does the patient have difficulty doing errands alone such as visiting a doctor's office or shopping?: No  Fall Risk  01/18/2017  Falls in the past year? No     Providers she sees regularly: She does not see other providers.  Eye care provider: She has not seen a provider in a few years. Cardiologist seen in 06/2015 because chest pain. According to pt, no further follow up was recommended.   [facility] 01/18/2017  Decreased Interest 0  Down, Depressed, Hopeless 0  PHQ - 2 Score 0    Mini-Cog - 01/18/17 1111    Normal clock drawing test?  yes    How many words correct?  3        Hearing Screening   125Hz  250Hz  500Hz  1000Hz  2000Hz  3000Hz  4000Hz  6000Hz  8000Hz   Right ear:   Fail Fail Fail  Fail    Left ear:   Fail Fail Fail  Fail      Visual Acuity Screening   Right eye Left eye Both eyes  Without correction: 20/30 20/200 20/25  With correction:        There is no immunization history on file for this patient.  Mammogram: 03/2015 [facility] 1 Colonoscopy: She has not had colon cancer screening. DEXA: Never had  one.  Hep C screening: She has not had it before, she would like to have it done today.  Concerns today:   Last follow up in 02/2016. She is upset because "nobody" called her with results after last OV. She has copy of lab results that were mailed to her after she called and requested them,per her report.   Hypertension:   Dx in 2016. Stopped antihypertensive medications over 6 months ago. It was causing dizziness and has not had any problem since then.  BP readings at home: Not checking it. Last eye exam: Overdue.   She has not noted unusual headache, visual changes, exertional chest pain, dyspnea,  focal weakness, or edema.   Lab Results  Component Value Date   CREATININE 0.98 02/14/2016   BUN 22 02/14/2016   NA 141 02/14/2016   K 4.6 02/14/2016   CL 104 02/14/2016   CO2 30 02/14/2016     Hyperlipidemia:  She was on Pravastatin 20 mg daily, she ran out a couple months ago. Following a low fat diet: Yes.. She was tolerating medication well but also would like to stop medication.  Hypothyroidism:  Currently she is on Levothyroxine 75 mcg, which she resumed 2 weeks ago. Tolerating medication well, no side effects reported. She has not noted palpitations, abdominal pain, changes in bowel habits, tremor, cold/heat intolerance, or abnormal weight loss.  Lab Results  Component Value Date   TSH 2.982 06/19/2015      Review of Systems  Constitutional: Negative for appetite change, fatigue and fever.  HENT: Positive for dental problem and hearing loss. Negative for ear pain, mouth sores, sore throat, trouble swallowing and voice change.   Eyes: Negative for redness and visual disturbance.  Respiratory: Negative for cough, shortness of breath and wheezing.   Cardiovascular: Negative for chest pain and leg swelling.  Gastrointestinal: Negative for abdominal pain, nausea and vomiting.       No changes in bowel habits.  Endocrine: Negative for cold intolerance, heat  intolerance, polydipsia, polyphagia and polyuria.  Genitourinary: Negative for decreased urine volume, dysuria, hematuria, vaginal bleeding and vaginal discharge.  Musculoskeletal: Negative for gait problem and neck pain.  Skin: Negative for color change and rash.  Neurological: Negative for seizures, syncope, weakness and headaches.  Hematological: Negative for adenopathy. Does not bruise/bleed easily.  Psychiatric/Behavioral: Negative for confusion and sleep disturbance. The patient is nervous/anxious.   All other systems reviewed and are negative.     Current Outpatient Medications on File Prior to Visit  Medication Sig Dispense Refill  . levothyroxine (SYNTHROID, LEVOTHROID) 75 MCG tablet Take 75 mcg by mouth daily.  0  . amLODipine (NORVASC) 5 MG tablet Take 1 tablet (5 mg total) by mouth 2 (two) times daily. (Patient not taking: Reported on 01/18/2017) 60 tablet 0  . calcium carbonate (OSCAL) 1500 (600 Ca) MG TABS tablet Take 600 mg of elemental calcium by mouth daily.    Marland Kitchen losartan (COZAAR) 25 MG tablet Take 1 tablet (25 mg total) by mouth daily. (Patient not taking: Reported on 01/18/2017) 30 tablet 1  . pravastatin (PRAVACHOL) 20 MG tablet Take 20 mg by mouth daily. Reported on 06/18/2015  1   No current facility-administered medications on file prior to visit.      Past Medical History:  Diagnosis Date  . Hearing loss   . History of echocardiogram    Echo 1/17 - EF 60-65%, normal wall motion, grade 1 diastolic dysfunction, LA upper limits of normal, moderate TR, PASP 41 mmHg  . History of nuclear stress test    Myoview 4/17 - no ischemia, EF 66%  . HLD (hyperlipidemia)   . Hypertension   . Hypothyroidism    "thyroid problem" unable to explain    Past Surgical History:  Procedure Laterality Date  . TONSILLECTOMY      Allergies  Allergen Reactions  . Maxzide [Hydrochlorothiazide W-Triamterene] Other (See Comments)    Severe hyponatremia with med.    Family  History  Problem Relation Age of Onset  . Stroke Mother   . Diabetes Mother   . Hypertension Mother     Social History   Socioeconomic History  . Marital status: Divorced    Spouse name: None  . Number of children: None  . Years of education: None  . Highest education level: None  Social Needs  . Financial resource strain: None  . Food insecurity - worry: None  . Food insecurity - inability: None  . Transportation needs - medical: None  . Transportation needs - non-medical: None  Occupational History  . None  Tobacco Use  . Smoking status: Former Smoker    Last attempt to quit: 01/23/2015    Years since quitting: 2.0  . Smokeless tobacco: Never Used  Substance and Sexual Activity  . Alcohol use: No    Alcohol/week: 0.0 oz  . Drug use: No  . Sexual activity: No  Other Topics Concern  . None  Social History Narrative  . None     Vitals:   01/18/17 1030  BP: 134/88  Pulse: 71  Resp: 16  Temp: 98.9 F (37.2 C)  SpO2: 97%   Body mass index is 26.76 kg/m.   Wt Readings from Last 3 Encounters:  01/18/17 137 lb (62.1 kg)  02/14/16 130 lb 8 oz (59.2 kg)  06/27/15 117 lb 6.4 oz (53.3 kg)     Physical Exam  Nursing note and vitals reviewed. Constitutional: She is oriented to person, place, and time. She appears well-developed. No distress.  HENT:  Head: Normocephalic and atraumatic.  Right Ear: Tympanic membrane, external ear and ear canal normal. Decreased hearing is noted.  Left Ear: Tympanic membrane, external ear and ear canal normal. Decreased hearing is noted.  Mouth/Throat: Uvula is midline, oropharynx is clear and moist and mucous membranes are normal. Abnormal dentition.  Eyes: Conjunctivae and EOM are normal. Pupils are equal, round, and reactive to light.  Neck: No tracheal deviation present. No thyromegaly present.  Cardiovascular: Normal rate and regular rhythm.  No murmur heard. Pulses:      Dorsalis pedis pulses are 2+ on the right side,  and 2+ on the left side.  Respiratory: Effort normal and breath sounds normal. No respiratory distress.  GI: Soft. She exhibits no mass. There is no hepatomegaly. There is no tenderness.  Genitourinary:  Genitourinary Comments: Breast: No masses,skin changes,or nipple discharge bilateral.  Musculoskeletal: She exhibits no edema.  Heberden's node and Bouchard's nodes. No signs of synovitis.   Lymphadenopathy:    She has no cervical adenopathy.    She has no axillary adenopathy.       Right: No supraclavicular adenopathy present.       Left: No supraclavicular adenopathy present.  Neurological: She is alert and oriented to person, place, and time. She has normal strength. No cranial nerve deficit. Coordination and gait normal.  Reflex Scores:      Bicep reflexes are 2+ on the right side and 2+ on the left side.      Patellar reflexes are 2+ on the right side and 2+ on the left side. Skin: Skin is warm. No rash noted. No erythema.  Psychiatric: Her speech is normal. Her mood appears anxious. Her affect is blunt. Cognition and memory are normal.  Fairly groomed, good eye contact.    ASSESSMENT AND PLAN:   Ms. Shyra was seen today for annual exam and Medicare preventive visit.  Diagnoses and all orders for this visit:  Lab Results  Component Value Date   CHOL 222 (H) 01/18/2017   HDL 63.20 01/18/2017   LDLCALC 139 (H) 01/18/2017   TRIG 97.0 01/18/2017   CHOLHDL 4 01/18/2017   Lab Results  Component Value Date   HGBA1C 5.9 01/18/2017   Lab Results  Component Value Date   CREATININE 1.05 01/18/2017   BUN 15 01/18/2017   NA 136 01/18/2017   K 4.2 01/18/2017   CL 99 01/18/2017   CO2 28 01/18/2017   Lab Results  Component Value Date   TSH 12.82 (H) 01/18/2017   Lab Results  Component Value Date   MICROALBUR 2.3 (H) 01/18/2017    Medicare annual wellness visit, subsequent  We discussed the importance of staying active, physically and mentally, as well as the  benefits of a healthy/balnace diet. Low impact exercise that involve stretching and strengthing are ideal. We discussed preventive screening for the next 5-10 years, summery of  recommendations given in AVS:  Colon cancer screening: Agrees with FIT. Every 1-2 years eye exam and glaucoma screening. Diabetes screening. Mammogram q 1-2 years. DEXA will be arranged. Fall prevention.  Advance directives and end of life discussed, strongly recommended and web site information given on AVS.   Routine general medical examination at a health care facility  Preventive guidelines reviewed. Vaccination not up to date, refused vaccines (Prevnar 13 ,Zoster,and influenza).  Ca++ and vit D supplementation recommended. Next CPE in a year.  The 10-year ASCVD risk score Denman George(Goff [place] Montez HagemanJr., et al., 2013) is: 7.5%   Values used to calculate the score:     Age: 89 years     Sex: Female     Is Non-Hispanic African American: No     Diabetic: No     Tobacco smoker: No     Systolic Blood Pressure: 134 mmHg     Is BP treated: No     HDL Cholesterol: 63.2 mg/dL     Total Cholesterol: 222 mg/dL  Hypertensive heart disease without heart failure  Adequately controlled on non pharmacologic treatment, so no changes. Recommend checking BP at home. No changes in current management. DASH - low salt diet recommended. Eye exam needed.  -     Comprehensive metabolic panel -     Microalbumin / creatinine urine ratio  Encounter for HCV screening test for high risk patient -     Hepatitis C antibody screen  Hyperlipidemia, unspecified hyperlipidemia type  Continue non pharmacologic treatment for now. Further recommendations will be given according to lab results.  -     Lipid panel  Bilateral hearing loss, unspecified hearing loss type  Chronic.  -     Ambulatory referral to Audiology  Hyperglycemia  Glucose 131 in 02/2016. Primary prevention through a healthy life style recommended.  -      Hemoglobin A1c  Hypothyroidism, unspecified type  No changes in current management, will follow labs done today and will give further recommendations accordingly. F/U in 6-12 months.  -     TSH -     T4, free  Asymptomatic postmenopausal estrogen deficiency -     DEXAScan; Future    Return in 6 months (on 07/18/2017) for Hypothryo,HTN.      Toshiyuki Fredell G. [place]az[place]Jordan, MD  [facility]. [place] office.

## 2017-01-18 ENCOUNTER — Encounter: Payer: Self-pay | Admitting: Family Medicine

## 2017-01-18 ENCOUNTER — Ambulatory Visit (INDEPENDENT_AMBULATORY_CARE_PROVIDER_SITE_OTHER): Payer: PPO | Admitting: Family Medicine

## 2017-01-18 VITALS — BP 134/88 | HR 71 | Temp 98.9°F | Resp 16 | Ht 60.0 in | Wt 137.0 lb

## 2017-01-18 DIAGNOSIS — Z9189 Other specified personal risk factors, not elsewhere classified: Secondary | ICD-10-CM | POA: Diagnosis not present

## 2017-01-18 DIAGNOSIS — R739 Hyperglycemia, unspecified: Secondary | ICD-10-CM

## 2017-01-18 DIAGNOSIS — H9193 Unspecified hearing loss, bilateral: Secondary | ICD-10-CM | POA: Diagnosis not present

## 2017-01-18 DIAGNOSIS — E039 Hypothyroidism, unspecified: Secondary | ICD-10-CM

## 2017-01-18 DIAGNOSIS — E785 Hyperlipidemia, unspecified: Secondary | ICD-10-CM

## 2017-01-18 DIAGNOSIS — Z780 Asymptomatic menopausal state: Secondary | ICD-10-CM

## 2017-01-18 DIAGNOSIS — I119 Hypertensive heart disease without heart failure: Secondary | ICD-10-CM | POA: Diagnosis not present

## 2017-01-18 DIAGNOSIS — Z1159 Encounter for screening for other viral diseases: Secondary | ICD-10-CM

## 2017-01-18 DIAGNOSIS — Z0000 Encounter for general adult medical examination without abnormal findings: Secondary | ICD-10-CM

## 2017-01-18 LAB — MICROALBUMIN / CREATININE URINE RATIO
CREATININE, U: 30.5 mg/dL
MICROALB UR: 2.3 mg/dL — AB (ref 0.0–1.9)
Microalb Creat Ratio: 7.7 mg/g (ref 0.0–30.0)

## 2017-01-18 LAB — LIPID PANEL
CHOLESTEROL: 222 mg/dL — AB (ref 0–200)
HDL: 63.2 mg/dL (ref >39.00)
LDL CALC: 139 mg/dL — AB (ref 0–99)
NonHDL: 158.8
TRIGLYCERIDES: 97 mg/dL (ref 0.0–149.0)
Total CHOL/HDL Ratio: 4
VLDL: 19.4 mg/dL (ref 0.0–40.0)

## 2017-01-18 LAB — COMPREHENSIVE METABOLIC PANEL
ALK PHOS: 71 U/L (ref 39–117)
ALT: 9 U/L (ref 0–35)
AST: 20 U/L (ref 0–37)
Albumin: 4.5 g/dL (ref 3.5–5.2)
BUN: 15 mg/dL (ref 6–23)
CHLORIDE: 99 meq/L (ref 96–112)
CO2: 28 mEq/L (ref 19–32)
CREATININE: 1.05 mg/dL (ref 0.40–1.20)
Calcium: 9.4 mg/dL (ref 8.4–10.5)
GFR: 55.44 mL/min — ABNORMAL LOW (ref >60.00)
Glucose, Bld: 92 mg/dL (ref 70–99)
POTASSIUM: 4.2 meq/L (ref 3.5–5.1)
SODIUM: 136 meq/L (ref 135–145)
TOTAL PROTEIN: 7.9 g/dL (ref 6.0–8.3)
Total Bilirubin: 0.7 mg/dL (ref 0.2–1.2)

## 2017-01-18 LAB — TSH: TSH: 12.82 u[IU]/mL — ABNORMAL HIGH (ref 0.35–4.50)

## 2017-01-18 LAB — T4, FREE: Free T4: 1.32 ng/dL (ref 0.60–1.60)

## 2017-01-18 LAB — HEMOGLOBIN A1C: HEMOGLOBIN A1C: 5.9 % (ref 4.6–6.5)

## 2017-01-18 NOTE — Patient Instructions (Addendum)
A few things to remember from today's visit:   Hypertensive heart disease without heart failure - Plan: Comprehensive metabolic panel, Microalbumin / creatinine urine ratio  Encounter for HCV screening test for high risk patient - Plan: Hepatitis C antibody screen  Hyperlipidemia, unspecified hyperlipidemia type - Plan: Lipid panel  Bilateral hearing loss, unspecified hearing loss type - Plan: Ambulatory referral to Audiology  Hyperglycemia - Plan: Hemoglobin A1c  Hypothyroidism, unspecified type - Plan: TSH, T4, free  Asymptomatic postmenopausal estrogen deficiency - Plan: DEXAScan  Routine general medical examination at a health care facility   A few tips:  -As we age balance is not as good as it was, so there is a higher risks for falls. Please remove small rugs and furniture that is "in your way" and could increase the risk of falls. Stretching exercises may help with fall prevention: Yoga and Tai Chi are some examples. Low impact exercise is better, so you are not very achy the next day.  -Sun screen and avoidance of direct sun light recommended. Caution with dehydration, if working outdoors be sure to drink enough fluids.  - Some medications are not safe as we age, increases the risk of side effects and can potentially interact with other medication you are also taken;  including some of over the counter medications. Be sure to let me know when you start a new medication even if it is a dietary/vitamin supplement.   -Healthy diet low in red meet/animal fat and sugar + regular physical activity is recommended.     Screening schedule for the next 5-10 years:  Colon cancer screening, today fecal cards.  Glaucoma screening/eye exam every 1-2 years.  Mammogram for breast cancer screening annually.  Flu vaccine annually. Pneumonia vaccine needed. Shingles is also needed.  Diabetes screening   Fall prevention Calcium 1200 mg and Vit 800 U daily.   Advance  directives:  Please see a lawyer and/or go to this website to help you with advanced directives and designating a health care power of attorney so that your wishes will be followed should you become too ill to make your own medical decisions.  [email or web address]       Please be sure medication list is accurate. If a new problem present, please set up appointment sooner than planned today.

## 2017-01-19 LAB — HEPATITIS C ANTIBODY
Hepatitis C Ab: NONREACTIVE
SIGNAL TO CUT-OFF: 0.01 (ref <1.00)

## 2017-01-23 ENCOUNTER — Telehealth: Payer: Self-pay | Admitting: Family Medicine

## 2017-01-23 NOTE — Telephone Encounter (Signed)
Copied from CRM [id number]. Topic: Quick Communication - Lab Results >> Jan 23, 2017 12:44 PM Crist InfanteHarrald, Kathy J wrote: Pt had labs done 11/16 and wants to know the results.  Pt states after the labs, Dr Swazil[place]Jordan advised meds might be changed. Please advise  Pt would like a copy of these labs mailed to her home.

## 2017-01-25 MED ORDER — PRAVASTATIN SODIUM 20 MG PO TABS: 20.0000 mg | ORAL_TABLET | Freq: Every day | ORAL | 3 refills | Status: DC

## 2017-01-25 MED ORDER — LEVOTHYROXINE SODIUM 75 MCG PO TABS: 75.0000 ug | ORAL_TABLET | Freq: Every day | ORAL | 1 refills | Status: DC

## 2017-01-28 ENCOUNTER — Other Ambulatory Visit: Payer: Self-pay | Admitting: *Deleted

## 2017-01-28 ENCOUNTER — Telehealth: Payer: Self-pay | Admitting: Family Medicine

## 2017-01-28 DIAGNOSIS — E039 Hypothyroidism, unspecified: Secondary | ICD-10-CM

## 2017-01-28 MED ORDER — LEVOTHYROXINE SODIUM 75 MCG PO TABS: 75.0000 ug | ORAL_TABLET | Freq: Every day | ORAL | 1 refills | Status: DC

## 2017-01-28 NOTE — Telephone Encounter (Signed)
Copied from CRM [phone]. Topic: Quick Communication - See Telephone Encounter >> Jan 28, 2017 11:11 AM Stephannie LiSimmons, Kenden Brandt L, NT wrote: Reason for UEA:[id number]:Patient called to verify when to make her appointment  ,please call back with lab results also , phone was messing up patient was unable to hear

## 2017-03-01 ENCOUNTER — Other Ambulatory Visit: Payer: Self-pay | Admitting: Family Medicine

## 2017-03-01 DIAGNOSIS — E2839 Other primary ovarian failure: Secondary | ICD-10-CM

## 2017-03-01 DIAGNOSIS — Z1231 Encounter for screening mammogram for malignant neoplasm of breast: Secondary | ICD-10-CM

## 2017-03-12 ENCOUNTER — Telehealth: Payer: Self-pay | Admitting: Family Medicine

## 2017-03-12 NOTE — Telephone Encounter (Signed)
Spoke with patient, results sent to patient. Patient stated she was having trouble with mychart password and would get help her next office visit.

## 2017-03-12 NOTE — Telephone Encounter (Signed)
This also must have been routed to me in error.

## 2017-03-12 NOTE — Telephone Encounter (Signed)
Copied from CRM [phone]. Topic: Quick Communication - Lab Results >> Mar 12, 2017  2:38 PM Everardo PacificMoton, Karina [name], [place] wrote: Patient calling because she would like to have her lab results mailed to her home that she had done on 01-18-2017. If someone could give her a call back about this at [phone]

## 2017-03-13 NOTE — Telephone Encounter (Signed)
Results mailed to patient;.

## 2017-03-13 NOTE — Telephone Encounter (Signed)
I thought we did mail results. Can you please mail results of last blood work done here in the office. Thanks, BJ

## 2017-05-02 ENCOUNTER — Ambulatory Visit: Payer: PPO | Admitting: Audiology

## 2018-01-16 DIAGNOSIS — H2513 Age-related nuclear cataract, bilateral: Secondary | ICD-10-CM | POA: Diagnosis not present

## 2018-01-16 DIAGNOSIS — H25042 Posterior subcapsular polar age-related cataract, left eye: Secondary | ICD-10-CM | POA: Diagnosis not present

## 2018-02-17 DIAGNOSIS — Z23 Encounter for immunization: Secondary | ICD-10-CM | POA: Diagnosis not present

## 2018-02-17 DIAGNOSIS — Z0000 Encounter for general adult medical examination without abnormal findings: Secondary | ICD-10-CM | POA: Diagnosis not present

## 2018-03-07 ENCOUNTER — Other Ambulatory Visit: Payer: Self-pay | Admitting: Family Medicine

## 2018-03-07 DIAGNOSIS — E785 Hyperlipidemia, unspecified: Secondary | ICD-10-CM

## 2018-03-07 DIAGNOSIS — E039 Hypothyroidism, unspecified: Secondary | ICD-10-CM

## 2018-04-16 ENCOUNTER — Telehealth: Payer: Self-pay | Admitting: Family Medicine

## 2018-04-16 NOTE — Telephone Encounter (Signed)
Attempted to call patient to schedule AWV, but phone number is disconnected; no other number available to contact patient. SF

## 2018-11-18 ENCOUNTER — Telehealth: Payer: Self-pay

## 2018-11-18 NOTE — Telephone Encounter (Signed)
NOTES ON FILE FROM [facility] [phone], SENT REFERRAL TO SCHEDULING °

## 2019-02-06 ENCOUNTER — Ambulatory Visit: Payer: PPO | Admitting: Cardiology

## 2019-02-17 ENCOUNTER — Ambulatory Visit: Payer: PPO | Admitting: Cardiology

## 2019-03-26 ENCOUNTER — Ambulatory Visit: Payer: Medicare Other | Admitting: Cardiology

## 2019-07-23 ENCOUNTER — Ambulatory Visit: Payer: Medicare Other | Admitting: Cardiology

## 2020-03-14 LAB — COLOGUARD: COLOGUARD: NEGATIVE

## 2020-11-10 ENCOUNTER — Other Ambulatory Visit: Payer: Self-pay | Admitting: Family Medicine

## 2020-11-10 DIAGNOSIS — I119 Hypertensive heart disease without heart failure: Secondary | ICD-10-CM

## 2020-12-08 ENCOUNTER — Ambulatory Visit
Admission: RE | Admit: 2020-12-08 | Discharge: 2020-12-08 | Disposition: A | Discharge status: Home / Self Care | Payer: Medicare Other | Source: Ambulatory Visit | Attending: Family Medicine | Admitting: Family Medicine

## 2020-12-08 ENCOUNTER — Other Ambulatory Visit: Payer: Self-pay

## 2020-12-08 ENCOUNTER — Ambulatory Visit: Payer: Medicare Other | Admitting: Cardiology

## 2020-12-08 DIAGNOSIS — I119 Hypertensive heart disease without heart failure: Secondary | ICD-10-CM

## 2020-12-08 MED ORDER — GADOBENATE DIMEGLUMINE 529 MG/ML IV SOLN
12.0000 mL | Freq: Once | INTRAVENOUS | Status: AC | PRN
  Administered 2020-12-08: 12 mL via INTRAVENOUS

## 2020-12-15 ENCOUNTER — Ambulatory Visit: Payer: Medicare Other | Admitting: Cardiology

## 2021-01-02 ENCOUNTER — Ambulatory Visit: Payer: Medicare Other | Admitting: Cardiology

## 2021-01-05 ENCOUNTER — Ambulatory Visit: Payer: Medicare Other | Admitting: Cardiology

## 2021-01-05 ENCOUNTER — Encounter: Payer: Self-pay | Admitting: Cardiology

## 2021-01-05 ENCOUNTER — Other Ambulatory Visit: Payer: Self-pay

## 2021-01-05 VITALS — BP 157/83 | HR 58 | Temp 98.0°F | Resp 16 | Ht 60.0 in | Wt 135.6 lb

## 2021-01-05 DIAGNOSIS — R0989 Other specified symptoms and signs involving the circulatory and respiratory systems: Secondary | ICD-10-CM

## 2021-01-05 DIAGNOSIS — I10 Essential (primary) hypertension: Secondary | ICD-10-CM

## 2021-01-05 DIAGNOSIS — E7800 Pure hypercholesterolemia, unspecified: Secondary | ICD-10-CM

## 2021-01-05 DIAGNOSIS — I701 Atherosclerosis of renal artery: Secondary | ICD-10-CM

## 2021-01-05 DIAGNOSIS — I739 Peripheral vascular disease, unspecified: Secondary | ICD-10-CM

## 2021-01-05 MED ORDER — ASPIRIN 81 MG PO CHEW: 81.0000 mg | CHEWABLE_TABLET | Freq: Every day | ORAL | Status: AC

## 2021-01-05 NOTE — Patient Instructions (Signed)
You are scheduled for neck ultrasound to look for blockages in your neck arteries.  You are also scheduled for checking your circulation in the legs by ultrasound.  Following which   You will be scheduled for angiogram to look for blockage in the kidney and also to look for blockage in your legs and see if he can fix the blockage.  You need to get blood work done at least a week before the procedure.

## 2021-01-05 NOTE — H&P (View-Only) (Signed)
Primary Physician/Referring:  Nickola Major, MD  Patient ID: Karina [name], female    DOB: January 27, 1951, 70 y.o.   MRN: [id number]  Chief Complaint  Patient presents with   Hypertension   Hyperlipidemia   New Patient (Initial Visit)    Referred by Terrill Mohr, MD   Tricuspid Insuffiency    HPI:    Karina [name]  is a 70 y.o. Caucasian female with history of tobacco use disorder quit sometime in 2015, resistant hypertension, tricuspid regurgitation, hyperlipidemia, hypothyroidism, and anxiety.  Recently diagnosed with bilateral renal artery stenosis. She is referred to our office by PCP Dr. Daron Offer for further evaluation and management of hypertension, TR, and hyperlipidemia.  Patient has had multiple medication intolerances due to side effects including hydralazine, propranolol, clonidine, lisinopril, as well as diuretics due to severe hyponatremia. She has also previously been on amlodipine 10 mg which caused back and leg pain, however she is presently tolerating amlodipine 5 mg daily.  Patient recently underwent MRA showing bilateral renal artery stenosis, right worse than left. Patient's PCP has also ordered sleep study.   Patient has not had any TIA-like symptoms.  On further questioning, she does state that she has chronic dyspnea that has remained stable without any PND or orthopnea and she is completely remain abstinent from tobacco use disorder.  She also complains of claudication involving the right hip and has to frequently stop while walking around her neighborhood.  No rest pain.  Past Medical History:  Diagnosis Date   Hearing loss    History of echocardiogram    Echo 1/17 - EF 60-65%, normal wall motion, grade 1 diastolic dysfunction, LA upper limits of normal, moderate TR, PASP 41 mmHg   History of nuclear stress test    Myoview 4/17 - no ischemia, EF 66%   HLD (hyperlipidemia)    Hypertension    Hypothyroidism    "thyroid problem" unable to explain   Past  Surgical History:  Procedure Laterality Date   TONSILLECTOMY     Family History  Problem Relation Age of Onset   Stroke Mother    Diabetes Mother    Hypertension Mother     Social History   Tobacco Use   Smoking status: Former    Types: Cigarettes    Quit date: 01/23/2015    Years since quitting: 5.9   Smokeless tobacco: Never  Substance Use Topics   Alcohol use: No    Alcohol/week: 0.0 standard drinks   Marital Status: Divorced   ROS  Review of Systems  Cardiovascular:  Positive for claudication (right hip) and dyspnea on exertion (chronic). Negative for chest pain and leg swelling.  Gastrointestinal:  Negative for melena.   Objective  Blood pressure (!) 157/83, pulse (!) 58, temperature 98 F (36.7 C), temperature source Temporal, resp. rate 16, height 5' (1.524 m), weight 135 lb 9.6 oz (61.5 kg), SpO2 97 %.  Vitals with BMI 01/05/2021 01/18/2017 02/14/2016  Height 5\' 0"  5\' 0"  5\' 0"   Weight 135 lbs 10 oz 137 lbs 130 lbs 8 oz  BMI 26.48 [id number]  Systolic [id number]  Diastolic 83 88 80  Pulse 58 71 96      Physical Exam Neck:     Vascular: Carotid bruit (bilateral) present. No JVD.  Cardiovascular:     Rate and Rhythm: Normal rate and regular rhythm.     Pulses: Intact distal pulses.          Radial pulses are 2+  on the right side and 2+ on the left side.       Femoral pulses are 1+ on the right side with bruit and 2+ on the left side with bruit.      Popliteal pulses are 0 on the right side and 0 on the left side.       Dorsalis pedis pulses are 1+ on the right side and 1+ on the left side.       Posterior tibial pulses are 0 on the right side and 0 on the left side.     Heart sounds: Normal heart sounds. No murmur heard.   No gallop.  Pulmonary:     Effort: Pulmonary effort is normal.     Breath sounds: Normal breath sounds.  Abdominal:     General: Bowel sounds are normal.     Palpations: Abdomen is soft.  Musculoskeletal:        General: No  swelling.    Laboratory examination:   No results for input(s): NA, K, CL, CO2, GLUCOSE, BUN, CREATININE, CALCIUM, GFRNONAA, GFRAA in the last 8760 hours. CrCl cannot be calculated (Patient's most recent lab result is older than the maximum 21 days allowed.).  CMP Latest Ref Rng & Units 01/18/2017 02/14/2016 06/27/2015  Glucose 70 - 99 mg/dL 92 027(O) 95  BUN 6 - 23 mg/dL 15 22 11   Creatinine 0.40 - 1.20 mg/dL 5.36 6.44  Sodium 135 - 145 mEq/L 136 141 134(L)  Potassium 3.5 - 5.1 mEq/L 4.2 4.6 4.2  Chloride 96 - 112 mEq/L 99 104 100  CO2 19 - 32 mEq/L 28 30 26   Calcium 8.4 - 10.5 mg/dL 9.4 9.0 9.2  Total Protein 6.0 - 8.3 g/dL 7.9 7.2 -  Total Bilirubin 0.2 - 1.2 mg/dL 0.7 0.3 -  Alkaline Phos 39 - 117 U/L 71 99 -  AST 0 - 37 U/L 20 18 -  ALT 0 - 35 U/L 9 11 -   CBC Latest Ref Rng & Units 06/23/2015 06/22/2015 06/19/2015  WBC 4.0 - 10.5 K/uL 5.7 6.8 4.9  Hemoglobin 12.0 - 15.0 g/dL 06/24/2015 06/21/2015 11.8(L)  Hematocrit 36.0 - 46.0 % 34.2(L) 36.0 34.3(L)  Platelets 150 - 400 K/uL 277 285 253    Lipid Panel No results for input(s): CHOL, TRIG, LDLCALC, VLDL, HDL, CHOLHDL, LDLDIRECT in the last 8760 hours.  HEMOGLOBIN A1C Lab Results  Component Value Date   HGBA1C 5.9 01/18/2017   TSH No results for input(s): TSH in the last 8760 hours.  External labs:  11/10/2020: TSH 1.0 Sodium 137, potassium 5.0, BUN 26, creatinine 0.94, AST 20, ALT 14, GFR >60 Direct LDL 72, total cholesterol 01/20/2017, triglycerides 48, HDL 63 Hgb 13.5, HCT 39.1, MCV 91.9, platelet 284  Allergies   Allergies  Allergen Reactions   Maxzide [Hydrochlorothiazide W-Triamterene] Other (See Comments)    Severe hyponatremia with med.    Medications Prior to Visit:   Outpatient Medications Prior to Visit  Medication Sig Dispense Refill   amLODipine (NORVASC) 10 MG tablet Take 10 mg by mouth daily.     atenolol (TENORMIN) 50 MG tablet Take 50 mg by mouth daily.     calcium carbonate (OSCAL) 1500 (600 Ca) MG TABS  tablet Take 600 mg of elemental calcium by mouth daily.     doxazosin (CARDURA) 2 MG tablet Take 1 mg by mouth daily.     levothyroxine (SYNTHROID) 100 MCG tablet Take 100 mcg by mouth every morning.     pravastatin (PRAVACHOL)  20 MG tablet Take 1 tablet (20 mg total) by mouth daily. Reported on 06/18/2015 90 tablet 3   telmisartan (MICARDIS) 40 MG tablet Take 40 mg by mouth daily.     levothyroxine (SYNTHROID, LEVOTHROID) 75 MCG tablet Take 1 tablet (75 mcg total) by mouth daily. 90 tablet 1   No facility-administered medications prior to visit.   Final Medications at End of Visit    Current Meds  Medication Sig   amLODipine (NORVASC) 10 MG tablet Take 10 mg by mouth daily.   aspirin (ASPIRIN CHILDRENS) 81 MG chewable tablet Chew 1 tablet (81 mg total) by mouth daily.   atenolol (TENORMIN) 50 MG tablet Take 50 mg by mouth daily.   calcium carbonate (OSCAL) 1500 (600 Ca) MG TABS tablet Take 600 mg of elemental calcium by mouth daily.   doxazosin (CARDURA) 2 MG tablet Take 1 mg by mouth daily.   levothyroxine (SYNTHROID) 100 MCG tablet Take 100 mcg by mouth every morning.   pravastatin (PRAVACHOL) 20 MG tablet Take 1 tablet (20 mg total) by mouth daily. Reported on 06/18/2015   telmisartan (MICARDIS) 40 MG tablet Take 40 mg by mouth daily.   Radiology:   No results found. MRI abdomen 12/08/2020:  ARTERIAL Aorta: Mild atheromatous irregularity. No aneurysm, dissection, or stenosis. Celiac axis: Mild proximal narrowing of indeterminate hemodynamic significance, patent and unremarkable distally. Superior mesenteric: Grossly patent, with classic distal branch anatomy. Left renal: Single, with mild short-segment stenosis just distal to its origin, patent distally. Right renal: Single, with ostial stenosis extending over length of less than 1 cm resulting in at least moderately severe stenosis, patent distally. Inferior mesenteric:  Patent Left iliac: Mild atheromatous irregularity. No  aneurysm or stenosis. Right iliac: Common and external iliac arteries patent. Origin stenosis of the internal iliac artery, patent distally.   VENOUS Patent hepatic veins, portal vein, SMV, splenic vein, bilateral renal veins, iliac confluence and IVC. No venous pathology evident.   IMPRESSION: 1. Bilateral renal artery proximal stenoses, right worse than left. 2. Aortoiliac atherosclerosis (ICD10-170.0) with origin stenosis of the right internal iliac artery.  Cardiac Studies:  Nuclear stress test 06/19/2015: 1. No reversible ischemia or infarction. 2. Normal left ventricular wall motion. 3. Left ventricular ejection fraction 66% 4. Low-risk stress test findings*.  External echocardiogram 09/29/2020: The left ventricular size is normal. There is mild concentric left ventricular hypertrophy. Left ventricular systolic function is normal. LV ejection fraction = 60-65%. No segmental wall motion abnormalities seen in the left ventricle  The right ventricle is normal in size and function.  The left atrium is mildly dilated. The right atrium is mildly dilated.  There is mild aortic regurgitation.  There is mild to moderate mitral regurgitation.  There is moderate tricuspid regurgitation. Estimated right ventricular systolic pressure is 47 mmHg. Mild pulmonary hypertension.  Trace to mild pulmonic valvular regurgitation.  IVC size was normal.  No significant change since 04/04/2015.   EKG:   EKG 01/05/2021: Normal sinus rhythm with rate of 56 bpm, left atrial enlargement, normal axis.  Incomplete right bundle branch block.  No evidence of ischemia  Assessment     ICD-10-CM   1. Resistant hypertension  I10 EKG 12-Lead    CBC    Basic metabolic panel    2. Renal artery stenosis (HCC)  I70.1     3. Pure hypercholesterolemia  E78.00     4. Claudication of gluteal region [facility]) Right  I73.9 PCV LOWER ARTERIAL (BILATERAL)    aspirin (ASPIRIN CHILDRENS)  81 MG chewable tablet    5.  Bilateral carotid bruits  R09.89 PCV CAROTID DUPLEX (BILATERAL)       Medications Discontinued During This Encounter  Medication Reason   levothyroxine (SYNTHROID, LEVOTHROID) 75 MCG tablet Error    Meds ordered this encounter  Medications   aspirin (ASPIRIN CHILDRENS) 81 MG chewable tablet    Sig: Chew 1 tablet (81 mg total) by mouth daily.    Recommendations:   Karina [name] is a 70 y.o. patient female patient with longstanding history of tobacco use disorder quit sometime in 2015, resistant hypertension ongoing for several years, chronic dyspnea on exertion, referred to me for evaluation of renal artery stenosis.  I reviewed the results of the renal artery MRA, she clearly has high-grade stenosis in the right renal artery greater than the left renal artery.  She is on maximal medical therapy in spite of this blood pressure continues to be uncontrolled.  There is no room for up titration of any of the medications.  She also has right common iliac artery stenosis, physical examination is also consistent with significant peripheral arterial disease.  She has symptoms of right hip claudication, she does try to go for a walk around her neighborhood but has to stop after a few minutes.  She also has bilateral carotid artery bruit.  I will schedule her for lower extremity arterial Doppler and also carotid artery duplex and schedule her for peripheral arteriogram and renal angiogram and possible angioplasty to renal arteries and also iliac arteries if necessary.  Start aspirin 81 mg daily for secondary prevention.  Lipids reviewed, good control.  She has quit smoking and has remained abstinent.  Dyspnea on exertion is chronic.  She does have moderate tricuspid regurgitation however echocardiogram also done in 2017 was very similar to the present echocardiogram including the mitral regurgitation.  Hence we will continue observation with regard to this, suspect dyspnea is probably related to  uncontrolled hypertension and probably underlying COPD.  I would like to see her back in 4 weeks to 6 weeks for follow-up, she will obtain BMP and CBC prior to the angiography.   Will schedule for peripheral arteriogram and possible angioplasty given symptoms and also difficult to control hypertension. Patient understands the risks, benefits, alternatives including medical therapy, CT angiography. Patient understands <1-2% risk of death, embolic complications, tissue loss, bleeding, infection, renal failure, urgent surgical revascularization, but not limited to these and wants to proceed.   This was a greater than 60-minute office visit encounter and discussions regarding complex medical issues and discussions regarding procedures and evaluation of external records.   Karina Prows, MD, [facility] 01/05/2021, 2:23 PM Office: [phone] Fax: [phone] Pager: [phone]

## 2021-01-05 NOTE — Progress Notes (Signed)
 Primary Physician/Referring:  [name], Karina A, MD  Patient ID: Karina [name], female    DOB: 03/02/1951, 70 y.o.   MRN: [id number]  Chief Complaint  Patient presents with   Hypertension   Hyperlipidemia   New Patient (Initial Visit)    Referred by Karina Eksir, MD   Tricuspid Insuffiency    HPI:    Karina [name]  is a 70 y.o. Caucasian female with history of tobacco use disorder quit sometime in 2015, resistant hypertension, tricuspid regurgitation, hyperlipidemia, hypothyroidism, and anxiety.  Recently diagnosed with bilateral renal artery stenosis. She is referred to our office by PCP Dr. Eksir for further evaluation and management of hypertension, TR, and hyperlipidemia.  Patient has had multiple medication intolerances due to side effects including hydralazine, propranolol, clonidine, lisinopril, as well as diuretics due to severe hyponatremia. She has also previously been on amlodipine 10 mg which caused back and leg pain, however she is presently tolerating amlodipine 5 mg daily.  Patient recently underwent MRA showing bilateral renal artery stenosis, right worse than left. Patient's PCP has also ordered sleep study.   Patient has not had any TIA-like symptoms.  On further questioning, she does state that she has chronic dyspnea that has remained stable without any PND or orthopnea and she is completely remain abstinent from tobacco use disorder.  She also complains of claudication involving the right hip and has to frequently stop while walking around her neighborhood.  No rest pain.  Past Medical History:  Diagnosis Date   Hearing loss    History of echocardiogram    Echo 1/17 - EF 60-65%, normal wall motion, grade 1 diastolic dysfunction, LA upper limits of normal, moderate TR, PASP 41 mmHg   History of nuclear stress test    Myoview 4/17 - no ischemia, EF 66%   HLD (hyperlipidemia)    Hypertension    Hypothyroidism    "thyroid problem" unable to explain   Past  Surgical History:  Procedure Laterality Date   TONSILLECTOMY     Family History  Problem Relation Age of Onset   Stroke Mother    Diabetes Mother    Hypertension Mother     Social History   Tobacco Use   Smoking status: Former    Types: Cigarettes    Quit date: 01/23/2015    Years since quitting: 5.9   Smokeless tobacco: Never  Substance Use Topics   Alcohol use: No    Alcohol/week: 0.0 standard drinks   Marital Status: Divorced   ROS  Review of Systems  Cardiovascular:  Positive for claudication (right hip) and dyspnea on exertion (chronic). Negative for chest pain and leg swelling.  Gastrointestinal:  Negative for melena.   Objective  Blood pressure (!) 157/83, pulse (!) 58, temperature 98 F (36.7 C), temperature source Temporal, resp. rate 16, height 5' (1.524 m), weight 135 lb 9.6 oz (61.5 kg), SpO2 97 %.  Vitals with BMI 01/05/2021 01/18/2017 02/14/2016  Height 5' 0" 5' 0" 5' 0"  Weight 135 lbs 10 oz 137 lbs 130 lbs 8 oz  BMI 26.48 26.76 25.5  Systolic 157 134 160  Diastolic 83 88 80  Pulse 58 71 96      Physical Exam Neck:     Vascular: Carotid bruit (bilateral) present. No JVD.  Cardiovascular:     Rate and Rhythm: Normal rate and regular rhythm.     Pulses: Intact distal pulses.          Radial pulses are 2+   on the right side and 2+ on the left side.       Femoral pulses are 1+ on the right side with bruit and 2+ on the left side with bruit.      Popliteal pulses are 0 on the right side and 0 on the left side.       Dorsalis pedis pulses are 1+ on the right side and 1+ on the left side.       Posterior tibial pulses are 0 on the right side and 0 on the left side.     Heart sounds: Normal heart sounds. No murmur heard.   No gallop.  Pulmonary:     Effort: Pulmonary effort is normal.     Breath sounds: Normal breath sounds.  Abdominal:     General: Bowel sounds are normal.     Palpations: Abdomen is soft.  Musculoskeletal:        General: No  swelling.    Laboratory examination:   No results for input(s): NA, K, CL, CO2, GLUCOSE, [name], CREATININE, CALCIUM, GFRNONAA, GFRAA in the last 8760 hours. CrCl cannot be calculated (Patient's most recent lab result is older than the maximum 21 days allowed.).  CMP Latest Ref Rng & Units 01/18/2017 02/14/2016 06/27/2015  Glucose 70 - 99 mg/dL 92 027(O) 95  [name] 6 - 23 mg/dL 15 22 11   Creatinine 0.40 - 1.20 mg/dL 5.36 6.44  Sodium 135 - 145 mEq/L 136 141 134(L)  Potassium 3.5 - 5.1 mEq/L 4.2 4.6 4.2  Chloride 96 - 112 mEq/L 99 104 100  CO2 19 - 32 mEq/L 28 30 26   Calcium 8.4 - 10.5 mg/dL 9.4 9.0 9.2  Total Protein 6.0 - 8.3 g/dL 7.9 7.2 -  Total Bilirubin 0.2 - 1.2 mg/dL 0.7 0.3 -  Alkaline Phos 39 - 117 U/L 71 99 -  AST 0 - 37 U/L 20 18 -  ALT 0 - 35 U/L 9 11 -   CBC Latest Ref Rng & Units 06/23/2015 06/22/2015 06/19/2015  WBC 4.0 - 10.5 K/uL 5.7 6.8 4.9  Hemoglobin 12.0 - 15.0 g/dL 06/24/2015 06/21/2015 11.8(L)  Hematocrit 36.0 - 46.0 % 34.2(L) 36.0 34.3(L)  Platelets 150 - 400 K/uL 277 285 253    Lipid Panel No results for input(s): CHOL, TRIG, LDLCALC, VLDL, HDL, CHOLHDL, LDLDIRECT in the last 8760 hours.  HEMOGLOBIN A1C Lab Results  Component Value Date   HGBA1C 5.9 01/18/2017   TSH No results for input(s): TSH in the last 8760 hours.  External labs:  11/10/2020: TSH 1.0 Sodium 137, potassium 5.0, [name] 26, creatinine 0.94, AST 20, ALT 14, GFR >60 Direct LDL 72, total cholesterol 01/20/2017, triglycerides 48, HDL 63 Hgb 13.5, HCT 39.1, MCV 91.9, platelet 284  Allergies   Allergies  Allergen Reactions   Maxzide [Hydrochlorothiazide W-Triamterene] Other (See Comments)    Severe hyponatremia with med.    Medications Prior to Visit:   Outpatient Medications Prior to Visit  Medication Sig Dispense Refill   amLODipine (NORVASC) 10 MG tablet Take 10 mg by mouth daily.     atenolol (TENORMIN) 50 MG tablet Take 50 mg by mouth daily.     calcium carbonate (OSCAL) 1500 (600 Ca) MG TABS  tablet Take 600 mg of elemental calcium by mouth daily.     doxazosin (CARDURA) 2 MG tablet Take 1 mg by mouth daily.     levothyroxine (SYNTHROID) 100 MCG tablet Take 100 mcg by mouth every morning.     pravastatin (PRAVACHOL)  20 MG tablet Take 1 tablet (20 mg total) by mouth daily. Reported on 06/18/2015 90 tablet 3   telmisartan (MICARDIS) 40 MG tablet Take 40 mg by mouth daily.     levothyroxine (SYNTHROID, LEVOTHROID) 75 MCG tablet Take 1 tablet (75 mcg total) by mouth daily. 90 tablet 1   No facility-administered medications prior to visit.   Final Medications at End of Visit    Current Meds  Medication Sig   amLODipine (NORVASC) 10 MG tablet Take 10 mg by mouth daily.   aspirin (ASPIRIN CHILDRENS) 81 MG chewable tablet Chew 1 tablet (81 mg total) by mouth daily.   atenolol (TENORMIN) 50 MG tablet Take 50 mg by mouth daily.   calcium carbonate (OSCAL) 1500 (600 Ca) MG TABS tablet Take 600 mg of elemental calcium by mouth daily.   doxazosin (CARDURA) 2 MG tablet Take 1 mg by mouth daily.   levothyroxine (SYNTHROID) 100 MCG tablet Take 100 mcg by mouth every morning.   pravastatin (PRAVACHOL) 20 MG tablet Take 1 tablet (20 mg total) by mouth daily. Reported on 06/18/2015   telmisartan (MICARDIS) 40 MG tablet Take 40 mg by mouth daily.   Radiology:   No results found. MRI abdomen 12/08/2020:  ARTERIAL Aorta: Mild atheromatous irregularity. No aneurysm, dissection, or stenosis. Celiac axis: Mild proximal narrowing of indeterminate hemodynamic significance, patent and unremarkable distally. Superior mesenteric: Grossly patent, with classic distal branch anatomy. Left renal: Single, with mild short-segment stenosis just distal to its origin, patent distally. Right renal: Single, with ostial stenosis extending over length of less than 1 cm resulting in at least moderately severe stenosis, patent distally. Inferior mesenteric:  Patent Left iliac: Mild atheromatous irregularity. No  aneurysm or stenosis. Right iliac: Common and external iliac arteries patent. Origin stenosis of the internal iliac artery, patent distally.   VENOUS Patent hepatic veins, portal vein, SMV, splenic vein, bilateral renal veins, iliac confluence and IVC. No venous pathology evident.   IMPRESSION: 1. Bilateral renal artery proximal stenoses, right worse than left. 2. Aortoiliac atherosclerosis (ICD10-170.0) with origin stenosis of the right internal iliac artery.  Cardiac Studies:  Nuclear stress test 06/19/2015: 1. No reversible ischemia or infarction. 2. Normal left ventricular wall motion. 3. Left ventricular ejection fraction 66% 4. Low-risk stress test findings*.  External echocardiogram 09/29/2020: The left ventricular size is normal. There is mild concentric left ventricular hypertrophy. Left ventricular systolic function is normal. LV ejection fraction = 60-65%. No segmental wall motion abnormalities seen in the left ventricle  The right ventricle is normal in size and function.  The left atrium is mildly dilated. The right atrium is mildly dilated.  There is mild aortic regurgitation.  There is mild to moderate mitral regurgitation.  There is moderate tricuspid regurgitation. Estimated right ventricular systolic pressure is 47 mmHg. Mild pulmonary hypertension.  Trace to mild pulmonic valvular regurgitation.  IVC size was normal.  No significant change since 04/04/2015.   EKG:   EKG 01/05/2021: Normal sinus rhythm with rate of 56 bpm, left atrial enlargement, normal axis.  Incomplete right bundle branch block.  No evidence of ischemia  Assessment     ICD-10-CM   1. Resistant hypertension  I10 EKG 12-Lead    CBC    Basic metabolic panel    2. Renal artery stenosis (HCC)  I70.1     3. Pure hypercholesterolemia  E78.00     4. Claudication of gluteal region [facility]) Right  I73.9 PCV LOWER ARTERIAL (BILATERAL)    aspirin (ASPIRIN CHILDRENS)  81 MG chewable tablet    5.  Bilateral carotid bruits  R09.89 PCV CAROTID DUPLEX (BILATERAL)       Medications Discontinued During This Encounter  Medication Reason   levothyroxine (SYNTHROID, LEVOTHROID) 75 MCG tablet Error    Meds ordered this encounter  Medications   aspirin (ASPIRIN CHILDRENS) 81 MG chewable tablet    Sig: Chew 1 tablet (81 mg total) by mouth daily.    Recommendations:   Karina [name] is a 70 y.o. patient female patient with longstanding history of tobacco use disorder quit sometime in 2015, resistant hypertension ongoing for several years, chronic dyspnea on exertion, referred to me for evaluation of renal artery stenosis.  I reviewed the results of the renal artery MRA, she clearly has high-grade stenosis in the right renal artery greater than the left renal artery.  She is on maximal medical therapy in spite of this blood pressure continues to be uncontrolled.  There is no room for up titration of any of the medications.  She also has right common iliac artery stenosis, physical examination is also consistent with significant peripheral arterial disease.  She has symptoms of right hip claudication, she does try to go for a walk around her neighborhood but has to stop after a few minutes.  She also has bilateral carotid artery bruit.  I will schedule her for lower extremity arterial Doppler and also carotid artery duplex and schedule her for peripheral arteriogram and renal angiogram and possible angioplasty to renal arteries and also iliac arteries if necessary.  Start aspirin 81 mg daily for secondary prevention.  Lipids reviewed, good control.  She has quit smoking and has remained abstinent.  Dyspnea on exertion is chronic.  She does have moderate tricuspid regurgitation however echocardiogram also done in 2017 was very similar to the present echocardiogram including the mitral regurgitation.  Hence we will continue observation with regard to this, suspect dyspnea is probably related to  uncontrolled hypertension and probably underlying COPD.  I would like to see her back in 4 weeks to 6 weeks for follow-up, she will obtain BMP and CBC prior to the angiography.   Will schedule for peripheral arteriogram and possible angioplasty given symptoms and also difficult to control hypertension. Patient understands the risks, benefits, alternatives including medical therapy, CT angiography. Patient understands <1-2% risk of death, embolic complications, tissue loss, bleeding, infection, renal failure, urgent surgical revascularization, but not limited to these and wants to proceed.   This was a greater than 60-minute office visit encounter and discussions regarding complex medical issues and discussions regarding procedures and evaluation of external records.   Adrian Prows, MD, [facility] 01/05/2021, 2:23 PM Office: [phone] Fax: [phone] Pager: [phone]

## 2021-01-12 ENCOUNTER — Ambulatory Visit: Payer: Medicare Other

## 2021-01-12 ENCOUNTER — Other Ambulatory Visit: Payer: Self-pay

## 2021-01-12 DIAGNOSIS — R0989 Other specified symptoms and signs involving the circulatory and respiratory systems: Secondary | ICD-10-CM

## 2021-01-12 DIAGNOSIS — I6523 Occlusion and stenosis of bilateral carotid arteries: Secondary | ICD-10-CM

## 2021-01-12 DIAGNOSIS — I739 Peripheral vascular disease, unspecified: Secondary | ICD-10-CM

## 2021-01-19 ENCOUNTER — Other Ambulatory Visit: Payer: Self-pay | Admitting: Family Medicine

## 2021-01-19 DIAGNOSIS — Z1231 Encounter for screening mammogram for malignant neoplasm of breast: Secondary | ICD-10-CM

## 2021-01-20 LAB — CBC
Hematocrit: 37.9 % (ref 34.0–46.6)
Hemoglobin: 12.7 g/dL (ref 11.1–15.9)
MCH: 31.3 pg (ref 26.6–33.0)
MCHC: 33.5 g/dL (ref 31.5–35.7)
MCV: 93 fL (ref 79–97)
Platelets: 257 10*3/uL (ref 150–450)
RBC: 4.06 x10E6/uL (ref 3.77–5.28)
RDW: 11.9 % (ref 11.7–15.4)
WBC: 6.2 10*3/uL (ref 3.4–10.8)

## 2021-01-20 LAB — BASIC METABOLIC PANEL
BUN/Creatinine Ratio: 18 (ref 12–28)
BUN: 19 mg/dL (ref 8–27)
CO2: 22 mmol/L (ref 20–29)
Calcium: 9.3 mg/dL (ref 8.7–10.3)
Chloride: 103 mmol/L (ref 96–106)
Creatinine, Ser: 1.07 mg/dL — ABNORMAL HIGH (ref 0.57–1.00)
Glucose: 104 mg/dL — ABNORMAL HIGH (ref 70–99)
Potassium: 4.3 mmol/L (ref 3.5–5.2)
Sodium: 140 mmol/L (ref 134–144)
eGFR: 56 mL/min/{1.73_m2} — ABNORMAL LOW (ref >59)

## 2021-01-23 DIAGNOSIS — I701 Atherosclerosis of renal artery: Secondary | ICD-10-CM | POA: Diagnosis present

## 2021-01-23 DIAGNOSIS — I739 Peripheral vascular disease, unspecified: Secondary | ICD-10-CM | POA: Diagnosis present

## 2021-01-24 ENCOUNTER — Other Ambulatory Visit: Payer: Self-pay

## 2021-01-24 ENCOUNTER — Encounter (HOSPITAL_COMMUNITY): Payer: Self-pay | Admitting: Cardiology

## 2021-01-24 ENCOUNTER — Encounter (HOSPITAL_COMMUNITY)
Admission: RE | Disposition: A | Discharge status: Home / Self Care | Payer: Self-pay | Source: Home / Self Care | Attending: Cardiology

## 2021-01-24 ENCOUNTER — Ambulatory Visit (HOSPITAL_COMMUNITY)
Admission: RE | Admit: 2021-01-24 | Discharge: 2021-01-24 | Disposition: A | Discharge status: Home / Self Care | Payer: Medicare Other | Attending: Cardiology | Admitting: Cardiology

## 2021-01-24 ENCOUNTER — Other Ambulatory Visit (HOSPITAL_COMMUNITY): Payer: Self-pay

## 2021-01-24 DIAGNOSIS — F419 Anxiety disorder, unspecified: Secondary | ICD-10-CM | POA: Insufficient documentation

## 2021-01-24 DIAGNOSIS — I10 Essential (primary) hypertension: Secondary | ICD-10-CM | POA: Insufficient documentation

## 2021-01-24 DIAGNOSIS — E039 Hypothyroidism, unspecified: Secondary | ICD-10-CM | POA: Insufficient documentation

## 2021-01-24 DIAGNOSIS — I739 Peripheral vascular disease, unspecified: Secondary | ICD-10-CM | POA: Diagnosis present

## 2021-01-24 DIAGNOSIS — Z87891 Personal history of nicotine dependence: Secondary | ICD-10-CM | POA: Insufficient documentation

## 2021-01-24 DIAGNOSIS — E785 Hyperlipidemia, unspecified: Secondary | ICD-10-CM | POA: Insufficient documentation

## 2021-01-24 DIAGNOSIS — I701 Atherosclerosis of renal artery: Secondary | ICD-10-CM | POA: Insufficient documentation

## 2021-01-24 DIAGNOSIS — I150 Renovascular hypertension: Secondary | ICD-10-CM

## 2021-01-24 DIAGNOSIS — I361 Nonrheumatic tricuspid (valve) insufficiency: Secondary | ICD-10-CM | POA: Insufficient documentation

## 2021-01-24 HISTORY — PX: RENAL ANGIOGRAPHY: CATH118260

## 2021-01-24 HISTORY — PX: PERIPHERAL VASCULAR INTERVENTION: CATH118257

## 2021-01-24 HISTORY — PX: LOWER EXTREMITY ANGIOGRAPHY: CATH118251

## 2021-01-24 LAB — POCT ACTIVATED CLOTTING TIME: Activated Clotting Time: 300 seconds

## 2021-01-24 SURGERY — RENAL ANGIOGRAPHY
Anesthesia: LOCAL | Laterality: Right

## 2021-01-24 MED ORDER — CLOPIDOGREL BISULFATE 75 MG PO TABS
75.0000 mg | ORAL_TABLET | Freq: Every day | ORAL | 0 refills | Status: DC
  Filled 2021-01-24: qty 30, 30d supply, fill #0

## 2021-01-24 MED ORDER — SODIUM CHLORIDE 0.9 % IV BOLUS
500.0000 mL | Freq: Once | INTRAVENOUS | Status: AC
  Administered 2021-01-24: 500 mL via INTRAVENOUS

## 2021-01-24 MED ORDER — HEPARIN (PORCINE) IN NACL 1000-0.9 UT/500ML-% IV SOLN
INTRAVENOUS | Status: DC | PRN
  Administered 2021-01-24 (×2): 500 mL

## 2021-01-24 MED ORDER — ONDANSETRON HCL 4 MG/2ML IJ SOLN: 4.0000 mg | Freq: Four times a day (QID) | INTRAMUSCULAR | Status: DC | PRN

## 2021-01-24 MED ORDER — AMLODIPINE BESYLATE 10 MG PO TABS: 5.0000 mg | ORAL_TABLET | Freq: Every day | ORAL | Status: DC | PRN

## 2021-01-24 MED ORDER — SODIUM CHLORIDE 0.9% FLUSH: 3.0000 mL | INTRAVENOUS | Status: DC | PRN

## 2021-01-24 MED ORDER — ASPIRIN EC 81 MG PO TBEC: 81.0000 mg | DELAYED_RELEASE_TABLET | Freq: Every day | ORAL | Status: DC

## 2021-01-24 MED ORDER — LIDOCAINE HCL (PF) 1 % IJ SOLN
INTRAMUSCULAR | Status: DC | PRN
  Administered 2021-01-24: 15 mL via INTRADERMAL

## 2021-01-24 MED ORDER — MIDAZOLAM HCL 2 MG/2ML IJ SOLN
INTRAMUSCULAR | Status: AC
  Filled 2021-01-24: qty 2

## 2021-01-24 MED ORDER — FENTANYL CITRATE (PF) 100 MCG/2ML IJ SOLN
INTRAMUSCULAR | Status: DC | PRN
  Administered 2021-01-24: 50 ug via INTRAVENOUS

## 2021-01-24 MED ORDER — IODIXANOL 320 MG/ML IV SOLN
INTRAVENOUS | Status: DC | PRN
  Administered 2021-01-24: 80 mL via INTRA_ARTERIAL

## 2021-01-24 MED ORDER — SODIUM CHLORIDE 0.9% FLUSH: 3.0000 mL | Freq: Two times a day (BID) | INTRAVENOUS | Status: DC

## 2021-01-24 MED ORDER — LABETALOL HCL 5 MG/ML IV SOLN: 10.0000 mg | INTRAVENOUS | Status: DC | PRN

## 2021-01-24 MED ORDER — SODIUM CHLORIDE 0.9 % IV SOLN: 250.0000 mL | INTRAVENOUS | Status: DC | PRN

## 2021-01-24 MED ORDER — HEPARIN (PORCINE) IN NACL 1000-0.9 UT/500ML-% IV SOLN
INTRAVENOUS | Status: AC
  Filled 2021-01-24: qty 500

## 2021-01-24 MED ORDER — HEPARIN SODIUM (PORCINE) 1000 UNIT/ML IJ SOLN
INTRAMUSCULAR | Status: DC | PRN
  Administered 2021-01-24: 8000 [IU] via INTRAVENOUS

## 2021-01-24 MED ORDER — MIDAZOLAM HCL 2 MG/2ML IJ SOLN
INTRAMUSCULAR | Status: DC | PRN
  Administered 2021-01-24: 2 mg via INTRAVENOUS

## 2021-01-24 MED ORDER — ONDANSETRON HCL 4 MG/2ML IJ SOLN
INTRAMUSCULAR | Status: DC | PRN
  Administered 2021-01-24: 4 mg via INTRAVENOUS

## 2021-01-24 MED ORDER — FENTANYL CITRATE (PF) 100 MCG/2ML IJ SOLN
INTRAMUSCULAR | Status: AC
  Filled 2021-01-24: qty 2

## 2021-01-24 MED ORDER — SODIUM CHLORIDE 0.9 % IV SOLN: INTRAVENOUS | Status: DC

## 2021-01-24 MED ORDER — CLOPIDOGREL BISULFATE 75 MG PO TABS: 75.0000 mg | ORAL_TABLET | Freq: Every day | ORAL | Status: DC

## 2021-01-24 MED ORDER — CLOPIDOGREL BISULFATE 75 MG PO TABS
300.0000 mg | ORAL_TABLET | Freq: Once | ORAL | Status: AC
  Administered 2021-01-24: 300 mg via ORAL
  Filled 2021-01-24: qty 4

## 2021-01-24 MED ORDER — LIDOCAINE HCL (PF) 1 % IJ SOLN
INTRAMUSCULAR | Status: AC
  Filled 2021-01-24: qty 30

## 2021-01-24 MED ORDER — SODIUM CHLORIDE 0.9 % WEIGHT BASED INFUSION: 1.0000 mL/kg/h | INTRAVENOUS | Status: DC

## 2021-01-24 MED ORDER — ACETAMINOPHEN 325 MG PO TABS: 650.0000 mg | ORAL_TABLET | ORAL | Status: DC | PRN

## 2021-01-24 SURGICAL SUPPLY — 15 items
CATH OMNI FLUSH 5F 65CM (CATHETERS) ×1 IMPLANT
CLOSURE MYNX CONTROL 6F/7F (Vascular Products) ×2 IMPLANT
GUIDE CATH VISTA JR4 6F (CATHETERS) ×1 IMPLANT
KIT ENCORE 26 ADVANTAGE (KITS) ×1 IMPLANT
KIT MICROPUNCTURE NIT STIFF (SHEATH) ×2 IMPLANT
KIT PV (KITS) ×3 IMPLANT
SHEATH PINNACLE 6F 10CM (SHEATH) ×1 IMPLANT
SHEATH PROBE COVER 6X72 (BAG) ×1 IMPLANT
STENT HERCULINK RX 5.0X15X135 (Permanent Stent) ×2 IMPLANT
STOPCOCK MORSE 400PSI 3WAY (MISCELLANEOUS) ×1 IMPLANT
TRANSDUCER W/STOPCOCK (MISCELLANEOUS) ×3 IMPLANT
TRAY PV CATH (CUSTOM PROCEDURE TRAY) ×3 IMPLANT
TUBING CIL FLEX 10 FLL-RA (TUBING) ×1 IMPLANT
WIRE BENTSON .035X145CM (WIRE) ×1 IMPLANT
WIRE SPARTACORE .014X190CM (WIRE) ×3 IMPLANT

## 2021-01-24 NOTE — Interval H&P Note (Signed)
History and Physical Interval Note:  01/24/2021 7:45 AM  Barkley Bruns  has presented today for surgery, with the diagnosis of coagulation.  The various methods of treatment have been discussed with the patient and family. After consideration of risks, benefits and other options for treatment, the patient has consented to  Procedure(s): RENAL ANGIOGRAPHY (N/A) LOWER EXTREMITY ANGIOGRAPHY (N/A) and possible angioplasty as a surgical intervention.  The patient's history has been reviewed, patient examined, no change in status, stable for surgery.  I have reviewed the patient's chart and labs.  Questions were answered to the patient's satisfaction.     Yates Decamp

## 2021-02-06 ENCOUNTER — Other Ambulatory Visit (HOSPITAL_COMMUNITY): Payer: Self-pay

## 2021-02-06 ENCOUNTER — Encounter: Payer: Self-pay | Admitting: Cardiology

## 2021-02-06 ENCOUNTER — Other Ambulatory Visit: Payer: Self-pay

## 2021-02-06 NOTE — Telephone Encounter (Signed)
Patient is currently on Plavix

## 2021-02-16 ENCOUNTER — Other Ambulatory Visit: Payer: Self-pay

## 2021-02-16 ENCOUNTER — Ambulatory Visit: Payer: Medicare Other | Admitting: Cardiology

## 2021-02-16 ENCOUNTER — Encounter: Payer: Self-pay | Admitting: Cardiology

## 2021-02-16 VITALS — BP 193/71 | HR 63 | Temp 98.1°F | Resp 16 | Ht 60.0 in | Wt 133.4 lb

## 2021-02-16 DIAGNOSIS — R0609 Other forms of dyspnea: Secondary | ICD-10-CM

## 2021-02-16 DIAGNOSIS — E785 Hyperlipidemia, unspecified: Secondary | ICD-10-CM

## 2021-02-16 DIAGNOSIS — Z9889 Other specified postprocedural states: Secondary | ICD-10-CM

## 2021-02-16 DIAGNOSIS — I10 Essential (primary) hypertension: Secondary | ICD-10-CM

## 2021-02-16 DIAGNOSIS — I6523 Occlusion and stenosis of bilateral carotid arteries: Secondary | ICD-10-CM

## 2021-02-16 MED ORDER — PRAVASTATIN SODIUM 20 MG PO TABS: 20.0000 mg | ORAL_TABLET | Freq: Every day | ORAL | 3 refills | Status: DC

## 2021-02-16 MED ORDER — LEVOTHYROXINE SODIUM 100 MCG PO TABS: 100.0000 ug | ORAL_TABLET | Freq: Every day | ORAL | 3 refills | Status: AC

## 2021-02-16 MED ORDER — AMLODIPINE BESYLATE 10 MG PO TABS: 10.0000 mg | ORAL_TABLET | Freq: Every day | ORAL | 3 refills | Status: DC

## 2021-02-16 MED ORDER — TELMISARTAN 40 MG PO TABS: 40.0000 mg | ORAL_TABLET | Freq: Every day | ORAL | 3 refills | Status: DC

## 2021-02-16 MED ORDER — ATENOLOL 50 MG PO TABS: 50.0000 mg | ORAL_TABLET | Freq: Every day | ORAL | 3 refills | Status: DC

## 2021-02-16 NOTE — Progress Notes (Signed)
Primary Physician/Referring:  Nickola Major, MD  Patient ID: Karina [name], female    DOB: 08-06-1950, 70 y.o.   MRN: [id number]  Chief Complaint  Patient presents with   Hypertension   Follow-up    6 weeks   HPI:    Karina [name]  is a 70 y.o. patient female patient with longstanding history of tobacco use disorder quit sometime in 2015, resistant hypertension ongoing for several years, chronic dyspnea on exertion.  She underwent renal arteriogram and also peripheral lower extremity arteriogram on 01/24/2021 and found to have high-grade right renal artery stenosis for which he underwent direct stenting with excellent results.  She was also found to have an ulcerated high-grade right common femoral artery 95% stenosis.  She has been referred to vascular surgery for endarterectomy.  She now presents for follow-up.  Patient's blood pressure has improved significantly, blood pressure at home has been relatively well controlled but did not 140 to Q000111Q mmHg systolic range.  She has noticed marked improvement in dyspnea since renal angioplasty.  No further PND.  Symptoms of claudication right hip has remained stable.  She has not had any complications from the procedure.   Past Medical History:  Diagnosis Date   Hearing loss    History of echocardiogram    Echo 1/17 - EF 60-65%, normal wall motion, grade 1 diastolic dysfunction, LA upper limits of normal, moderate TR, PASP 41 mmHg   History of nuclear stress test    Myoview 4/17 - no ischemia, EF 66%   HLD (hyperlipidemia)    Hypertension    Hypothyroidism    "thyroid problem" unable to explain   Past Surgical History:  Procedure Laterality Date   LOWER EXTREMITY ANGIOGRAPHY N/A 01/24/2021   Procedure: LOWER EXTREMITY ANGIOGRAPHY;  Surgeon: Adrian Prows, MD;  Location: [facility];  Service: Cardiovascular;  Laterality: N/A;   PERIPHERAL VASCULAR INTERVENTION Right 01/24/2021   Procedure: PERIPHERAL VASCULAR INTERVENTION;   Surgeon: Adrian Prows, MD;  Location: [place] [facility];  Service: Cardiovascular;  Laterality: Right;  Renal   RENAL ANGIOGRAPHY N/A 01/24/2021   Procedure: RENAL ANGIOGRAPHY;  Surgeon: Adrian Prows, MD;  Location: [facility];  Service: Cardiovascular;  Laterality: N/A;   TONSILLECTOMY     Family History  Problem Relation Age of Onset   Stroke Mother    Diabetes Mother    Hypertension Mother     Social History   Tobacco Use   Smoking status: Former    Packs/day: 1.00    Years: 30.00    Pack years: 30.00    Types: Cigarettes    Quit date: 01/23/2015    Years since quitting: 6.0   Smokeless tobacco: Never  Substance Use Topics   Alcohol use: No    Alcohol/week: 0.0 standard drinks   Marital Status: Divorced   ROS  Review of Systems  Cardiovascular:  Positive for claudication (right hip) and dyspnea on exertion (Improved since renal angioplasty\). Negative for chest pain and leg swelling.  Gastrointestinal:  Negative for melena.   Objective  Blood pressure (!) 193/71, pulse 63, temperature 98.1 F (36.7 C), temperature source Temporal, resp. rate 16, height 5' (1.524 m), weight 133 lb 6.4 oz (60.5 kg), SpO2 98 %.  Vitals with BMI 02/16/2021 02/16/2021 01/24/2021  Height - 5\' 0"  -  Weight - 133 lbs 6 oz -  BMI - 123XX123 -  Systolic 0000000 Q000111Q -  Diastolic 71 64 -  Pulse 63 58 55  Physical Exam Neck:     Vascular: Carotid bruit (bilateral) present. No JVD.  Cardiovascular:     Rate and Rhythm: Normal rate and regular rhythm.     Pulses: Intact distal pulses.          Radial pulses are 2+ on the right side and 2+ on the left side.       Femoral pulses are 1+ on the right side with bruit and 2+ on the left side with bruit.      Popliteal pulses are 0 on the right side and 0 on the left side.       Dorsalis pedis pulses are 1+ on the right side and 1+ on the left side.       Posterior tibial pulses are 0 on the right side and 0 on the left side.     Heart sounds:  Normal heart sounds. No murmur heard.   No gallop.  Pulmonary:     Effort: Pulmonary effort is normal.     Breath sounds: Normal breath sounds.  Abdominal:     General: Bowel sounds are normal.     Palpations: Abdomen is soft.  Musculoskeletal:        General: No swelling.    Laboratory examination:   Recent Labs    01/19/21 1332  NA 140  K 4.3  CL 103  CO2 22  GLUCOSE 104*  BUN 19  CREATININE 1.07*  CALCIUM 9.3   CrCl cannot be calculated (Patient's most recent lab result is older than the maximum 21 days allowed.).  CMP Latest Ref Rng & Units 01/19/2021 01/18/2017 02/14/2016  Glucose 70 - 99 mg/dL 104(H) 92 131(H)  BUN 8 - 27 mg/dL 19 15 22   Creatinine 0.57 - 1.00 mg/dL 1.07(H) 1.05 0.98  Sodium 134 - 144 mmol/L 140 136 141  Potassium 3.5 - 5.2 mmol/L 4.3 4.2 4.6  Chloride 96 - 106 mmol/L 103 99 104  CO2 20 - 29 mmol/L 22 28 30   Calcium 8.7 - 10.3 mg/dL 9.3 9.4 9.0  Total Protein 6.0 - 8.3 g/dL - 7.9 7.2  Total Bilirubin 0.2 - 1.2 mg/dL - 0.7 0.3  Alkaline Phos 39 - 117 U/L - 71 99  AST 0 - 37 U/L - 20 18  ALT 0 - 35 U/L - 9 11   CBC Latest Ref Rng & Units 01/19/2021 06/23/2015 06/22/2015  WBC 3.4 - 10.8 x10E3/uL 6.2 5.7 6.8  Hemoglobin 11.1 - 15.9 g/dL 12.7 12.6 13.5  Hematocrit 34.0 - 46.6 % 37.9 34.2(L) 36.0  Platelets 150 - 450 x10E3/uL 257 277 285    Lipid Panel No results for input(s): CHOL, TRIG, LDLCALC, VLDL, HDL, CHOLHDL, LDLDIRECT in the last 8760 hours.  HEMOGLOBIN A1C Lab Results  Component Value Date   HGBA1C 5.9 01/18/2017   TSH No results for input(s): TSH in the last 8760 hours.  External labs:  11/10/2020: TSH 1.0 Sodium 137, potassium 5.0, BUN 26, creatinine 0.94, AST 20, ALT 14, GFR >60 Direct LDL 72, total cholesterol 143, triglycerides 48, HDL 63 Hgb 13.5, HCT 39.1, MCV 91.9, platelet 284  Allergies   Allergies  Allergen Reactions   Maxzide [Hydrochlorothiazide W-Triamterene] Other (See Comments)    Severe hyponatremia with  med.   Hydralazine     Pt does not know reaction     Medications Prior to Visit:   Outpatient Medications Prior to Visit  Medication Sig Dispense Refill   aspirin (ASPIRIN CHILDRENS) 81 MG chewable tablet Chew 1  tablet (81 mg total) by mouth daily.     clopidogrel (PLAVIX) 75 MG tablet Take 1 tablet (75 mg total) by mouth daily. 30 tablet 0   levothyroxine (SYNTHROID) 100 MCG tablet Take 100 mcg by mouth daily before breakfast.     Multiple Vitamins-Minerals (MULTIVITAMIN WITH MINERALS) tablet Take 1 tablet by mouth daily.     pravastatin (PRAVACHOL) 20 MG tablet Take 1 tablet (20 mg total) by mouth daily. Reported on 06/18/2015 90 tablet 3   atenolol (TENORMIN) 50 MG tablet Take 50 mg by mouth daily.     amLODipine (NORVASC) 10 MG tablet Take 0.5 tablets (5 mg total) by mouth daily as needed (If BP > 150/90 mm Hg). (Patient not taking: Reported on 02/16/2021)     calcium carbonate (OSCAL) 1500 (600 Ca) MG TABS tablet Take 600 mg of elemental calcium by mouth daily.     telmisartan (MICARDIS) 40 MG tablet Take 40 mg by mouth daily.     No facility-administered medications prior to visit.   Final Medications at End of Visit    Current Meds  Medication Sig   aspirin (ASPIRIN CHILDRENS) 81 MG chewable tablet Chew 1 tablet (81 mg total) by mouth daily.   clopidogrel (PLAVIX) 75 MG tablet Take 1 tablet (75 mg total) by mouth daily.   levothyroxine (SYNTHROID) 100 MCG tablet Take 100 mcg by mouth daily before breakfast.   Multiple Vitamins-Minerals (MULTIVITAMIN WITH MINERALS) tablet Take 1 tablet by mouth daily.   pravastatin (PRAVACHOL) 20 MG tablet Take 1 tablet (20 mg total) by mouth daily. Reported on 06/18/2015   [DISCONTINUED] atenolol (TENORMIN) 50 MG tablet Take 50 mg by mouth daily.   Radiology:   MRI abdomen 12/08/2020:  Aorta: Mild atheromatous irregularity. No aneurysm, dissection, or stenosis. Celiac axis: Mild proximal narrowing of indeterminate hemodynamic significance,  patent and unremarkable distally. Superior mesenteric: Grossly patent, with classic distal branch anatomy. Left renal: Single, with mild short-segment stenosis just distal to its origin, patent distally. Right renal: Single, with ostial stenosis extending over length of less than 1 cm resulting in at least moderately severe stenosis, patent distally. Inferior mesenteric:  Patent Left iliac: Mild atheromatous irregularity. No aneurysm or stenosis. Right iliac: Common and external iliac arteries patent. Origin stenosis of the internal iliac artery, patent distally.   VENOUS Patent hepatic veins, portal vein, SMV, splenic vein, bilateral renal veins, iliac confluence and IVC. No venous pathology evident.   IMPRESSION: 1. Bilateral renal artery proximal stenoses, right worse than left. 2. Aortoiliac atherosclerosis (ICD10-170.0) with origin stenosis of the right internal iliac artery.  Peripheral arteriogram/renal arteriogram and angioplasty 01/24/2021: Abdominal aorta reveals no significant disease, 2 renal arteries on either sides.  Right renal artery ostium has a 90% stenosis, left renal artery is widely patent. Right common femoral artery in the proximal and mid segment has a 90 to 95% ulcerated stenosis with heavy plaque burden.  Otherwise the right SFA and below the knee there is three-vessel runoff with minimal disease. Left common femoral artery is widely patent, left proximal SFA at the origin has a 80% stenosis.  Mid to distal segment has focal about 40 mm to 60 mm of 70 to 80% stenosis.  Three-vessel runoff below the left knee with minimal disease.  Successful direct stenting of the right renal artery ostium with implantation of a 5.0 x 15 mm Herculink balloon expandable stent, stenosis reduced from 90% to 0%, pressure gradient reduced from >146mmHg to 0 mmHg.  No complications.  80  mL contrast utilized.  Recommendation: Plavix for at least 4 to [redacted] weeks along with aspirin indefinitely.   Patient will need right common femoral artery endarterectomy in view of symptomatic claudication.  If her symptoms of claudication improve medical therapy for left lower extremity PAD.  Dr. Trula Slade has been contacted regarding surgical evaluation.  Cardiac Studies:  Nuclear stress test 06/19/2015: 1. No reversible ischemia or infarction. 2. Normal left ventricular wall motion. 3. Left ventricular ejection fraction 66% 4. Low-risk stress test findings*.  External echocardiogram 09/29/2020: The left ventricular size is normal. There is mild concentric left ventricular hypertrophy. Left ventricular systolic function is normal. LV ejection fraction = 60-65%. No segmental wall motion abnormalities seen in the left ventricle  The right ventricle is normal in size and function.  The left atrium is mildly dilated. The right atrium is mildly dilated.  There is mild aortic regurgitation.  There is mild to moderate mitral regurgitation.  There is moderate tricuspid regurgitation. Estimated right ventricular systolic pressure is 47 mmHg. Mild pulmonary hypertension.  Trace to mild pulmonic valvular regurgitation.  IVC size was normal.  No significant change since 04/04/2015.   Lower Extremity Arterial Duplex 01/12/2021:  Diffuse heterogeneous plaque in the right distal iliac and CFA. There is >70% stenosis in the right CFA.  There is diffuse monophasic waveform pattern throughout the right lower extremity due to proximal stenosis. Diffuse heterogenous plaque noted in the left common femoral and left proximal SFA with >70% stenosis in the left proximal SFA.  There is diffuse monophasic waveform throughout the left lower extremity due to proximal stenosis. Bilateral ABI critically reduced and could not be calculated due to low flow state.  Consider further vascular work-up.  Carotid artery duplex 01/12/2021:  Duplex suggests stenosis in the right internal carotid artery (50-69%), upper end of spectrum.  Duplex suggests stenosis in the right common carotid artery (<50%). Duplex suggests stenosis in the right external carotid artery (<50%). Duplex suggests stenosis in the left internal carotid artery (1-15%). Duplex suggests stenosis in the left common carotid artery (<50%). Antegrade right vertebral artery flow. Antegrade left vertebral artery flow. Follow up in six months is appropriate if clinically indicated.  Peripheral arteriogram/renal arteriogram and angioplasty 01/24/2021: Abdominal aorta reveals no significant disease, 2 renal arteries on either sides.  Right renal artery ostium has a 90% stenosis, left renal artery is widely patent. Right common femoral artery in the proximal and mid segment has a 90 to 95% ulcerated stenosis with heavy plaque burden.  Otherwise the right SFA and below the knee there is three-vessel runoff with minimal disease. Left common femoral artery is widely patent, left proximal SFA at the origin has a 80% stenosis.  Mid to distal segment has focal about 40 mm to 60 mm of 70 to 80% stenosis.  Three-vessel runoff below the left knee with minimal disease.  Successful direct stenting of the right renal artery ostium with implantation of a 5.0 x 15 mm Herculink balloon expandable stent, stenosis reduced from 90% to 0%, pressure gradient reduced from >190mmHg to 0 mmHg.  No complications.  80 mL contrast utilized.  Recommendation: Plavix for at least 4 to [redacted] weeks along with aspirin indefinitely.  Patient will need right common femoral artery endarterectomy in view of symptomatic claudication.  If her symptoms of claudication improve medical therapy for left lower extremity PAD.  Dr. Trula Slade has been contacted regarding surgical evaluation.  EKG:   EKG 01/05/2021: Normal sinus rhythm with rate of 56 bpm, left  atrial enlargement, normal axis.  Incomplete right bundle branch block.  No evidence of ischemia  Assessment     ICD-10-CM   1. Primary hypertension  I10  amLODipine (NORVASC) 10 MG tablet    atenolol (TENORMIN) 50 MG tablet    telmisartan (MICARDIS) 40 MG tablet    PCV RENAL/RENAL ARTERY DUPLEX COMPLETE    2. Dyspnea on exertion  R06.09     3. Asymptomatic bilateral carotid artery stenosis  I65.23     4. History of stent insertion of right renal artery 01/24/2021  Z98.890 PCV RENAL/RENAL ARTERY DUPLEX COMPLETE       Medications Discontinued During This Encounter  Medication Reason   calcium carbonate (OSCAL) 1500 (600 Ca) MG TABS tablet    atenolol (TENORMIN) 50 MG tablet Reorder   telmisartan (MICARDIS) 40 MG tablet Reorder   amLODipine (NORVASC) 10 MG tablet Reorder    Meds ordered this encounter  Medications   amLODipine (NORVASC) 10 MG tablet    Sig: Take 1 tablet (10 mg total) by mouth daily.    Dispense:  90 tablet    Refill:  3   atenolol (TENORMIN) 50 MG tablet    Sig: Take 1 tablet (50 mg total) by mouth daily.    Dispense:  90 tablet    Refill:  3   telmisartan (MICARDIS) 40 MG tablet    Sig: Take 1 tablet (40 mg total) by mouth daily.    Dispense:  90 tablet    Refill:  3    Recommendations:   Karina [name] is a 70 y.o. patient female patient with longstanding history of tobacco use disorder quit sometime in 2015, resistant hypertension ongoing for several years, chronic dyspnea on exertion.  She underwent renal arteriogram and also peripheral lower extremity arteriogram on 01/24/2021 and found to have high-grade right renal artery stenosis for which he underwent direct stenting with excellent results.  She was also found to have an ulcerated high-grade right common femoral artery 95% stenosis.  She has been referred to vascular surgery for endarterectomy.  She now presents for follow-up.  Patient's blood pressure has improved significantly, blood pressure at home has been relatively well controlled but did not 140 to Q000111Q mmHg systolic range.  We will restart amlodipine 10 mg daily.  I suspect her blood pressure  will completely normalize with this.  Patient is also noticed marked improvement in dyspnea and also she now states that she was having episodes of PND which is completely resolved since renal angioplasty.  Her right groin site is healing well.  She is seeing Dr. Annamarie Major in the near future for right common femoral artery endarterectomy for symptoms of claudication.  Carotid artery duplex reveals moderate stenosis in the right and mild stenosis on the left, will continue 6 monthly surveillance.  I like to see her back in 3 months for follow-up hypertension specifically.  Lipids are well controlled and she is on a statin.  She can discontinue Plavix after she is done with the prescription she has and continue aspirin alone.    Adrian Prows, MD, [facility] 02/16/2021, 1:56 PM Office: [phone] Fax: [phone] Pager: [phone]

## 2021-03-02 ENCOUNTER — Ambulatory Visit
Admission: RE | Admit: 2021-03-02 | Discharge: 2021-03-02 | Disposition: A | Discharge status: Home / Self Care | Payer: Medicare Other | Source: Ambulatory Visit | Attending: Family Medicine | Admitting: Family Medicine

## 2021-03-02 DIAGNOSIS — Z1231 Encounter for screening mammogram for malignant neoplasm of breast: Secondary | ICD-10-CM

## 2021-03-13 ENCOUNTER — Encounter: Payer: Medicare Other | Admitting: Surgery

## 2021-05-18 ENCOUNTER — Ambulatory Visit: Payer: Medicare Other | Admitting: Cardiology

## 2021-07-17 ENCOUNTER — Telehealth: Payer: Self-pay | Admitting: Cardiology

## 2021-07-17 DIAGNOSIS — I739 Peripheral vascular disease, unspecified: Secondary | ICD-10-CM

## 2021-07-17 NOTE — Telephone Encounter (Signed)
ICD-10-CM   ?1. Claudication in peripheral vascular disease (HCC)  I73.9 Ambulatory referral to Vascular Surgery  ?  ? ?Orders Placed This Encounter  ?Procedures  ? Ambulatory referral to Vascular Surgery  ?  Referral Priority:   Routine  ?  Referral Type:   Surgical  ?  Referral Reason:   Specialty Services Required  ?  Referred to Provider:   Nada Libman, MD  ?  Requested Specialty:   Vascular Surgery  ?  Number of Visits Requested:   1  ?  ?

## 2021-08-08 ENCOUNTER — Other Ambulatory Visit: Payer: Self-pay | Admitting: *Deleted

## 2021-08-08 DIAGNOSIS — I739 Peripheral vascular disease, unspecified: Secondary | ICD-10-CM

## 2021-08-17 ENCOUNTER — Ambulatory Visit: Payer: Medicare Other

## 2021-08-17 DIAGNOSIS — I700 Atherosclerosis of aorta: Secondary | ICD-10-CM

## 2021-08-17 DIAGNOSIS — I10 Essential (primary) hypertension: Secondary | ICD-10-CM

## 2021-08-17 DIAGNOSIS — Z9889 Other specified postprocedural states: Secondary | ICD-10-CM

## 2021-08-17 DIAGNOSIS — I6523 Occlusion and stenosis of bilateral carotid arteries: Secondary | ICD-10-CM

## 2021-08-20 NOTE — Progress Notes (Signed)
Just FYI.

## 2021-08-20 NOTE — Progress Notes (Signed)
Carotid artery duplex 08/17/2021: Duplex suggests stenosis in the right internal carotid artery (>=70%). Duplex suggests stenosis in the right external carotid artery (<50%). The right PSV internal/common carotid artery ratio of 6.32 is consistent with a stenosis of >70%. Velocity 461/112 cm/s. Duplex suggests stenosis in the left internal carotid artery (1-15%). Duplex suggests stenosis in the left external carotid artery (<50%). Antegrade right vertebral artery flow. Antegrade left vertebral artery flow. Compared to the study done on 01/12/2021, right ICA stenosis has progressed from <70%. Follow up in six months is appropriate if clinically indicated.

## 2021-08-21 ENCOUNTER — Encounter: Payer: Self-pay | Admitting: Surgery

## 2021-08-21 ENCOUNTER — Ambulatory Visit (INDEPENDENT_AMBULATORY_CARE_PROVIDER_SITE_OTHER): Payer: No Typology Code available for payment source | Admitting: Surgery

## 2021-08-21 ENCOUNTER — Ambulatory Visit (HOSPITAL_COMMUNITY)
Admission: RE | Admit: 2021-08-21 | Discharge: 2021-08-21 | Disposition: A | Discharge status: Home / Self Care | Payer: No Typology Code available for payment source | Source: Ambulatory Visit | Attending: Surgery | Admitting: Surgery

## 2021-08-21 VITALS — BP 181/77 | HR 61 | Temp 97.9°F | Resp 20 | Ht 60.0 in | Wt 130.5 lb

## 2021-08-21 DIAGNOSIS — I70213 Atherosclerosis of native arteries of extremities with intermittent claudication, bilateral legs: Secondary | ICD-10-CM | POA: Diagnosis not present

## 2021-08-21 DIAGNOSIS — I739 Peripheral vascular disease, unspecified: Secondary | ICD-10-CM | POA: Insufficient documentation

## 2021-08-21 NOTE — Progress Notes (Signed)
[facility] [place]  Patient name: Karina [name] MRN: [id number] DOB: 1950/06/06 Sex: female   REQUESTING PROVIDER:    Dr. Jacinto Halim   REASON FOR CONSULT:    PAD  HISTORY OF PRESENT ILLNESS:   Karina [name] is a 71 y.o. female, who is referred for evaluation of right leg claudication.  The patient states that she gets buttock and calf cramping after walking approximately 100 steps.  This goes away with resting.  She denies rest pain or ulcers.  She recently sprained her ankle and is concerned about some discoloration of her foot.  Patient has a history of renal artery stenosis, treated with stenting.  She is on a statin for hypercholesterolemia.  She is medically managed for hypertension.  She does suffer from hearing loss.  PAST MEDICAL HISTORY    Past Medical History:  Diagnosis Date   Hearing loss    History of echocardiogram    Echo 1/17 - EF 60-65%, normal wall motion, grade 1 diastolic dysfunction, LA upper limits of normal, moderate TR, PASP 41 mmHg   History of nuclear stress test    Myoview 4/17 - no ischemia, EF 66%   HLD (hyperlipidemia)    Hypertension    Hypothyroidism    "thyroid problem" unable to explain     FAMILY HISTORY   Family History  Problem Relation Age of Onset   Stroke Mother    Diabetes Mother    Hypertension Mother     SOCIAL HISTORY:   Social History   Socioeconomic History   Marital status: Divorced    Spouse name: Not on file   Number of children: 2   Years of education: Not on file   Highest education level: Not on file  Occupational History   Not on file  Tobacco Use   Smoking status: Former    Packs/day: 1.00    Years: 30.00    Total pack years: 30.00    Types: Cigarettes    Quit date: 01/23/2015    Years since quitting: 6.5   Smokeless tobacco: Never  Vaping Use   Vaping Use: Never used  Substance and Sexual Activity   Alcohol use: No    Alcohol/week:  0.0 standard drinks of alcohol   Drug use: No   Sexual activity: Never  Other Topics Concern   Not on file  Social History Narrative   Not on file   Social Determinants of Health   Financial Resource Strain: Not on file  Food Insecurity: Not on file  Transportation Needs: Not on file  Physical Activity: Not on file  Stress: Not on file  Social Connections: Not on file  Intimate Partner Violence: Not on file    ALLERGIES:    Allergies  Allergen Reactions   Maxzide [Hydrochlorothiazide W-Triamterene] Other (See Comments)    Severe hyponatremia with med.   Hydralazine     Pt does not know reaction     CURRENT MEDICATIONS:    Current Outpatient Medications  Medication Sig Dispense Refill   amLODipine (NORVASC) 10 MG tablet Take 1 tablet (10 mg total) by mouth daily. (Patient taking differently: Take 5 mg by mouth daily.) 90 tablet 3   aspirin (ASPIRIN CHILDRENS) 81 MG chewable tablet Chew 1 tablet (81 mg total) by mouth daily.     atenolol (TENORMIN) 50 MG tablet Take 1 tablet (50 mg total) by mouth daily. 90 tablet 3   levothyroxine (SYNTHROID) 100 MCG tablet Take 1 tablet (100 mcg  total) by mouth daily before breakfast. 90 tablet 3   Multiple Vitamins-Minerals (MULTIVITAMIN WITH MINERALS) tablet Take 1 tablet by mouth daily.     pravastatin (PRAVACHOL) 20 MG tablet Take 1 tablet (20 mg total) by mouth daily. Reported on 06/18/2015 90 tablet 3   telmisartan (MICARDIS) 40 MG tablet Take 1 tablet (40 mg total) by mouth daily. 90 tablet 3   No current facility-administered medications for this visit.    REVIEW OF SYSTEMS:   [X]  denotes positive finding, [ ]  denotes negative finding Cardiac  Comments:  Chest pain or chest pressure:    Shortness of breath upon exertion:    Short of breath when lying flat:    Irregular heart rhythm:        Vascular    Pain in calf, thigh, or hip brought on by ambulation: x   Pain in feet at night that wakes you up from your sleep:      Blood clot in your veins:    Leg swelling:         Pulmonary    Oxygen at home:    Productive cough:     Wheezing:         Neurologic    Sudden weakness in arms or legs:     Sudden numbness in arms or legs:     Sudden onset of difficulty speaking or slurred speech:    Temporary loss of vision in one eye:     Problems with dizziness:         Gastrointestinal    Blood in stool:      Vomited blood:         Genitourinary    Burning when urinating:     Blood in urine:        Psychiatric    Major depression:         Hematologic    Bleeding problems:    Problems with blood clotting too easily:        Skin    Rashes or ulcers:        Constitutional    Fever or chills:     PHYSICAL EXAM:   Vitals:   08/21/21 1346  BP: (!) 181/77  Pulse: 61  Resp: 20  Temp: 97.9 F (36.6 C)  SpO2: 95%  Weight: 130 lb 8 oz (59.2 kg)  Height: 5' (1.524 m)    GENERAL: The patient is a well-nourished female, in no acute distress. The vital signs are documented above. CARDIAC: There is a regular rate and rhythm.  VASCULAR: Nonpalpable right pedal pulse  MUSCULOSKELETAL: There are no major deformities or cyanosis. NEUROLOGIC: No focal weakness or paresthesias are detected. SKIN: There are no ulcers or rashes noted. PSYCHIATRIC: The patient has a normal affect.  STUDIES:   I have reviewed the following: +-------+-----------+-----------+------------+------------+  ABI/TBIToday's ABIToday's TBIPrevious ABIPrevious TBI  +-------+-----------+-----------+------------+------------+  Right  0.32       0          see below                 +-------+-----------+-----------+------------+------------+  Left   0.76       0.27       see below                 +-------+-----------+-----------+------------+------------+       ASSESSMENT and PLAN   PAD with claudication: The patient has a known high-grade right common femoral stenosis.  She has about a 90%  stenosis by  angiography performed in November 2022.  She gets symptoms at approximately 100 steps.  She is here today with family.  I discussed that the absolute indication for revascularization would be rest pain, nonhealing wound, or severe infection, none of which she is suffering from.  We discussed proceeding with right femoral endarterectomy with patch angioplasty.  I stated that this would be an elective procedure and would significantly improve her claudication symptoms.  I discussed the risk of leg swelling, bleeding, and incisional complications.  She would like to think about this and will contact me if she decides to proceed with surgery.   Charlena Cross, MD, FACS [facility] [phone] Pager [phone]

## 2022-05-15 ENCOUNTER — Other Ambulatory Visit: Payer: Self-pay | Admitting: Cardiology

## 2022-05-15 DIAGNOSIS — E785 Hyperlipidemia, unspecified: Secondary | ICD-10-CM

## 2023-09-30 ENCOUNTER — Encounter: Payer: Self-pay | Admitting: Cardiology

## 2023-09-30 ENCOUNTER — Ambulatory Visit: Attending: Cardiology | Admitting: Cardiology

## 2023-09-30 VITALS — BP 189/80 | HR 84 | Resp 16 | Ht 60.0 in | Wt 130.8 lb

## 2023-09-30 DIAGNOSIS — E7800 Pure hypercholesterolemia, unspecified: Secondary | ICD-10-CM

## 2023-09-30 DIAGNOSIS — Z9889 Other specified postprocedural states: Secondary | ICD-10-CM | POA: Diagnosis not present

## 2023-09-30 DIAGNOSIS — I1A0 Resistant hypertension: Secondary | ICD-10-CM

## 2023-09-30 DIAGNOSIS — I739 Peripheral vascular disease, unspecified: Secondary | ICD-10-CM

## 2023-09-30 MED ORDER — AMLODIPINE BESYLATE 10 MG PO TABS: 10.0000 mg | ORAL_TABLET | Freq: Every morning | ORAL | 3 refills | Status: AC

## 2023-09-30 MED ORDER — TELMISARTAN 20 MG PO TABS: 20.0000 mg | ORAL_TABLET | Freq: Every evening | ORAL | 3 refills | Status: AC

## 2023-09-30 MED ORDER — ATENOLOL 25 MG PO TABS: 25.0000 mg | ORAL_TABLET | Freq: Every morning | ORAL | 3 refills | Status: DC

## 2023-09-30 NOTE — Patient Instructions (Signed)
 Medication Instructions:  Your physician has recommended you make the following change in your medication:  1) Take your amlodipine  and atenolol  in the morning and take your telmisartan  in the evening  *If you need a refill on your cardiac medications before your next appointment, please call your pharmacy*  Lab Work: BMET, Lipids, and CBC - please go to any [facility] location to have these drawn   Testing/Procedures: Carotid Ultrasound  Your physician has requested that you have a carotid duplex. This test is an ultrasound of the carotid arteries in your neck. It looks at blood flow through these arteries that supply the brain with blood. Allow one hour for this exam. There are no restrictions or special instructions.   Lower Extremity Duplex/ABI's Your physician has requested that you have a lower extremity arterial duplex. This test is an ultrasound of the arteries in the legs. It looks at arterial blood flow in the legs. Allow one hour for Lower Arterial scans. There are no restrictions or special instructions.  Please note: We ask at that you not bring children with you during ultrasound (echo/ vascular) testing. Due to room size and safety concerns, children are not allowed in the ultrasound rooms during exams. Our front office staff cannot provide observation of children in our lobby area while testing is being conducted. An adult accompanying a patient to their appointment will only be allowed in the ultrasound room at the discretion of the ultrasound technician under special circumstances. We apologize for any inconvenience.   Follow-Up: At [facility], you and your health needs are our priority.  As part of our continuing mission to provide you with exceptional heart care, our providers are all part of one team.  This team includes your primary Cardiologist (physician) and Advanced Practice Providers or APPs (Physician Assistants and Nurse Practitioners) who all work together to  provide you with the care you need, when you need it.  Your next appointment:   2 months  Provider:   Gordy Bergamo, MD  Other Instructions We will be in touch with you to arrange your peripheral angiogram

## 2023-09-30 NOTE — Progress Notes (Signed)
 Cardiology Office Note:  .   Date:  09/30/2023  ID:  Karina [name], DOB 1951/02/24, MRN [id number] PCP: Leila Lucie LABOR, MD  [facility] Providers Cardiologist:  Wilbert Bihari, MD   History of Present Illness: .   Karina [name] is a 73 y.o. female patient with longstanding history of tobacco use disorder quit sometime in 2015, resistant hypertension ongoing for several years, renovascular pretension, peripheral arterial disease with high-grade right CFA stenosis noted by angiography on 01/24/2021, asymptomatic high-grade right ICA stenosis and chronic dyspnea on exertion.   She underwent renal arteriogram and also peripheral lower extremity arteriogram on 01/24/2021 and found to have high-grade right renal artery stenosis for which he underwent direct stenting with excellent results.  She was also found to have an ulcerated high-grade right common femoral artery 95% stenosis.  She has been referred to vascular surgery for endarterectomy and was seen by Dr. Serene, however appears that surgery never happened, patient decided not to pursue this.  I had seen her greater than 3 years ago.  She now presents with worsening symptoms of claudication involving the right leg, gait difficulty.  Discussed the use of AI scribe software for clinical note transcription with the patient, who gave verbal consent to proceed.  History of Present Illness Karina [name] is a 73 year old female with right femoral artery blockage who presents with right foot numbness and cramping noted in 2022. She is accompanied by her daughter.  She experiences persistent numbness and occasional cramping in the right foot, which is relieved by repositioning. A clicking sound occurs in the joint when the cramp resolves. Cold sensations are also present in the right foot.  Arterial blockage in the right leg was previously identified, and surgery was recommended but not pursued due to concerns about complications.  Her symptoms have led to decreased physical activity over the past two years, as she avoids walking to prevent discomfort and limits activity due to fear of cramping.  Blood pressure management is inconsistent. She takes amlodipine  10 mg, atenolol  25 mg, and telmisartan  20 mg once daily, adjusting based on home readings ranging from 119 to 150 mmHg. Anxiety, meals, and hot weather contribute to elevated readings. She is a former smoker.  Labs   External Labs:  Care everywhere labs 12/13/2022:  Total cholesterol 191, triglycerides 108, HDL 66, LDL 105.  A1c 5.7%.  TSH normal at 1.393.  Sodium 135, potassium 4.7, BUN 15, creatinine 0.99, EGFR 60 mL.  Hb 14.0/HCT 42.1, platelets 267.  ROS  Review of Systems  Cardiovascular:  Positive for claudication (right leg). Negative for chest pain, dyspnea on exertion and leg swelling.   Physical Exam:   VS:  BP (!) 189/80 (BP Location: Left Arm, Patient Position: Sitting, Cuff Size: Normal)   Pulse 84   Resp 16   Ht 5' (1.524 m)   Wt 130 lb 12.8 oz (59.3 kg)   SpO2 97%   BMI 25.55 kg/m    Wt Readings from Last 3 Encounters:  09/30/23 130 lb 12.8 oz (59.3 kg)  08/21/21 130 lb 8 oz (59.2 kg)  02/16/21 133 lb 6.4 oz (60.5 kg)    Physical Exam Neck:     Vascular: No JVD.  Cardiovascular:     Rate and Rhythm: Normal rate and regular rhythm.     Pulses:          Carotid pulses are  on the right side with bruit and  on the left side  with bruit.      Femoral pulses are 1+ on the right side with bruit and 2+ on the left side with bruit.      Dorsalis pedis pulses are 0 on the right side and 2+ on the left side.       Posterior tibial pulses are 0 on the right side and 2+ on the left side.     Heart sounds: Normal heart sounds. No murmur heard.    No gallop.  Pulmonary:     Effort: Pulmonary effort is normal.     Breath sounds: Normal breath sounds.  Abdominal:     General: Bowel sounds are normal.     Palpations: Abdomen is soft.   Musculoskeletal:     Right lower leg: No edema.     Left lower leg: No edema.    Studies Reviewed: SABRA     Peripheral arteriogram/renal arteriogram and angioplasty 01/24/2021: Abdominal aorta reveals no significant disease, 2 renal arteries on either sides.  Right renal artery ostium has a 90% stenosis, left renal artery is widely patent. Right common femoral artery in the proximal and mid segment has a 90 to 95% ulcerated stenosis with heavy plaque burden.  Otherwise the right SFA and below the knee there is three-vessel runoff with minimal disease. Left common femoral artery is widely patent, left proximal SFA at the origin has a 80% stenosis.  Mid to distal segment has focal about 40 mm to 60 mm of 70 to 80% stenosis.  Three-vessel runoff below the left knee with minimal disease.  Successful direct stenting of the right renal artery ostium with implantation of a 5.0 x 15 mm Herculink balloon expandable stent, stenosis reduced from 90% to 0%, pressure gradient reduced from >138mmHg to 0 mmHg.   Carotid artery duplex 08/17/2021: Duplex suggests stenosis in the right internal carotid artery (>=70%). Duplex suggests stenosis in the right external carotid artery (<50%). The right PSV internal/common carotid artery ratio of 6.32 is consistent with a stenosis of >70%. Velocity 461/112 cm/s. Duplex suggests stenosis in the left internal carotid artery (1-15%). Duplex suggests stenosis in the left external carotid artery (<50%). Antegrade right vertebral artery flow. Antegrade left vertebral artery flow. Compared to the study done on 01/12/2021, right ICA stenosis has progressed from <70%. Follow up in six months is appropriate if clinically indicated.  Renal artery duplex  08/17/2021: No evidence of renal artery occlusive disease in either renal artery. Bilateral kidney appears small and measures with increased resistivity index suggesting medico renal disease Moderate diffuse plaque noted in the  abdominal aorta. Left renal artery stent appears patent.  EKG:    EKG Interpretation Date/Time:  Monday September 30 2023 16:17:49 EDT Ventricular Rate:  83 PR Interval:  148 QRS Duration:  74 QT Interval:  414 QTC Calculation: 486 R Axis:   18  Text Interpretation: EKG 09/30/2023: Normal sinus rhythm at rate of 83 bpm, normal axis, no evidence of ischemia, normal EKG.  No significant change from 06/22/2015. Confirmed by Elizardo Chilson, Jagadeesh ([id number]) on 09/30/2023 4:42:54 PM    Medications ordered    Meds ordered this encounter  Medications   amLODipine  (NORVASC ) 10 MG tablet    Sig: Take 1 tablet (10 mg total) by mouth in the morning.    Dispense:  90 tablet    Refill:  3   atenolol  (TENORMIN ) 25 MG tablet    Sig: Take 1 tablet (25 mg total) by mouth in the morning.    Dispense:  90 tablet    Refill:  3   telmisartan  (MICARDIS ) 20 MG tablet    Sig: Take 1 tablet (20 mg total) by mouth every evening.    Dispense:  90 tablet    Refill:  3     ASSESSMENT AND PLAN: .      ICD-10-CM   1. Resistant hypertension  I1A.0 EKG 12-Lead    Basic metabolic panel with GFR    Lipid panel    CBC    VAS US  ABI WITH/WO TBI    VAS US  CAROTID    VAS US  LOWER EXTREMITY ARTERIAL DUPLEX    2. History of stent insertion of right renal artery 01/24/2021  Z98.890 Basic metabolic panel with GFR    Lipid panel    CBC    VAS US  ABI WITH/WO TBI    VAS US  CAROTID    VAS US  LOWER EXTREMITY ARTERIAL DUPLEX    3. Claudication in peripheral vascular disease (HCC)  I73.9 Basic metabolic panel with GFR    Lipid panel    CBC    VAS US  ABI WITH/WO TBI    VAS US  CAROTID    VAS US  LOWER EXTREMITY ARTERIAL DUPLEX    4. Pure hypercholesterolemia  E78.00 Basic metabolic panel with GFR    Lipid panel    CBC    VAS US  ABI WITH/WO TBI    VAS US  CAROTID    VAS US  LOWER EXTREMITY ARTERIAL DUPLEX     Assessment & Plan Peripheral artery disease with claudication Chronic peripheral artery disease with  claudication in the right leg, likely due to right femoral artery blockage. Symptoms include numbness and cramping, suggesting possible disease progression. Previous elective surgery was not pursued due to fear of complications. Risks of bleeding, infection, and need for emergent surgery, with less than 1% risk, were discussed. -  Will schedule for peripheral arteriogram and possible angioplasty given symptoms. Patient understands the risks, benefits, alternatives including medical therapy, CT angiography. Patient understands <1-2% risk of death, embolic complications, bleeding, infection, renal failure, urgent surgical revascularization, but not limited to these and wants to proceed. - Order peripheral arteriogram to assess the extent of blockage in the right leg. - Consider angioplasty or stenting if feasible during the arteriogram. - Order ankle-brachial index (ABI) and lower extremity arterial duplex to evaluate lower extremity circulation.  Hypertension Hypertension with inconsistent medication adherence. Blood pressure readings at home vary, with occasional elevations. Current regimen includes amlodipine , atenolol , and telmisartan , but adherence is poor. Blood pressure control is crucial due to cardiovascular risk factors. - Instruct to take amlodipine  10 mg once daily in the morning. - Instruct to take atenolol  25 mg once daily in the morning. - Instruct to take telmisartan  20 mg once daily at night. - Order blood work including lipids, BMP, and CBC.  Carotid artery stenosis Right carotid artery stenosis greater than 70% as of 2023. No recent evaluation of carotid arteries. Potential risk for cerebrovascular events if stenosis has progressed. - Order carotid artery duplex ultrasound to assess current status of carotid stenosis.  Smoking History of smoking, contributing to cardiovascular risk. No current smoking reported.  General Health Maintenance Routine screenings and preventative  health measures are necessary. She has not had any blood work or routine check-ups since September of last year. - Encourage regular follow-up with primary care for routine health maintenance.   Signed,  Gordy Bergamo, MD, [facility] 09/30/2023, 6:27 PM [facility] [address] [place], [place] [address] Phone: [phone].  Fax:  [phone]

## 2023-10-03 ENCOUNTER — Other Ambulatory Visit: Payer: Self-pay

## 2023-10-03 ENCOUNTER — Telehealth: Payer: Self-pay | Admitting: *Deleted

## 2023-10-03 ENCOUNTER — Encounter: Payer: Self-pay | Admitting: *Deleted

## 2023-10-03 DIAGNOSIS — Z9889 Other specified postprocedural states: Secondary | ICD-10-CM

## 2023-10-03 DIAGNOSIS — I739 Peripheral vascular disease, unspecified: Secondary | ICD-10-CM

## 2023-10-03 DIAGNOSIS — I1A0 Resistant hypertension: Secondary | ICD-10-CM

## 2023-10-03 DIAGNOSIS — E7800 Pure hypercholesterolemia, unspecified: Secondary | ICD-10-CM

## 2023-10-03 NOTE — Telephone Encounter (Signed)
 I spoke with patient and scheduled her for PV angiogram on August 28 at 1 PM with Dr Ladona  Instructions reviewed with patient.  Copy of instructions to be mailed to her and sent through my chart

## 2023-10-03 NOTE — Telephone Encounter (Signed)
 Patient needs 2 month follow up with Dr Ladona scheduled.  I placed call to patient to schedule this appointment.  Left message to call office

## 2023-10-04 ENCOUNTER — Ambulatory Visit: Payer: Self-pay | Admitting: *Deleted

## 2023-10-04 LAB — BASIC METABOLIC PANEL WITH GFR
BUN/Creatinine Ratio: 21 (ref 12–28)
BUN: 19 mg/dL (ref 8–27)
CO2: 21 mmol/L (ref 20–29)
Calcium: 9.2 mg/dL (ref 8.7–10.3)
Chloride: 100 mmol/L (ref 96–106)
Creatinine, Ser: 0.9 mg/dL (ref 0.57–1.00)
Glucose: 93 mg/dL (ref 70–99)
Potassium: 5.3 mmol/L — ABNORMAL HIGH (ref 3.5–5.2)
Sodium: 138 mmol/L (ref 134–144)
eGFR: 67 mL/min/1.73 (ref >59)

## 2023-10-04 LAB — LIPID PANEL
Chol/HDL Ratio: 2.4 ratio (ref 0.0–4.4)
Cholesterol, Total: 155 mg/dL (ref 100–199)
HDL: 65 mg/dL (ref >39)
LDL Chol Calc (NIH): 77 mg/dL (ref 0–99)
Triglycerides: 63 mg/dL (ref 0–149)
VLDL Cholesterol Cal: 13 mg/dL (ref 5–40)

## 2023-10-04 LAB — CBC
Hematocrit: 40.6 % (ref 34.0–46.6)
Hemoglobin: 12.8 g/dL (ref 11.1–15.9)
MCH: 30.7 pg (ref 26.6–33.0)
MCHC: 31.5 g/dL (ref 31.5–35.7)
MCV: 97 fL (ref 79–97)
Platelets: 323 x10E3/uL (ref 150–450)
RBC: 4.17 x10E6/uL (ref 3.77–5.28)
RDW: 12.8 % (ref 11.7–15.4)
WBC: 7.3 x10E3/uL (ref 3.4–10.8)

## 2023-10-04 NOTE — Telephone Encounter (Signed)
 Appointment has been made for patient to see Dr Ladona on October 2

## 2023-10-10 ENCOUNTER — Ambulatory Visit (HOSPITAL_BASED_OUTPATIENT_CLINIC_OR_DEPARTMENT_OTHER)
Admission: RE | Admit: 2023-10-10 | Discharge: 2023-10-10 | Discharge status: Home / Self Care | Source: Ambulatory Visit | Attending: Cardiology | Admitting: Cardiology

## 2023-10-10 ENCOUNTER — Ambulatory Visit (HOSPITAL_COMMUNITY)
Admission: RE | Admit: 2023-10-10 | Discharge: 2023-10-10 | Disposition: A | Discharge status: Home / Self Care | Source: Ambulatory Visit | Attending: Cardiology | Admitting: Cardiology

## 2023-10-10 ENCOUNTER — Inpatient Hospital Stay (HOSPITAL_COMMUNITY)
Admission: RE | Admit: 2023-10-10 | Discharge: 2023-10-10 | Disposition: A | Discharge status: Home / Self Care | Source: Ambulatory Visit | Attending: Cardiology | Admitting: Cardiology

## 2023-10-10 DIAGNOSIS — I739 Peripheral vascular disease, unspecified: Secondary | ICD-10-CM | POA: Diagnosis present

## 2023-10-10 DIAGNOSIS — I1A0 Resistant hypertension: Secondary | ICD-10-CM

## 2023-10-10 DIAGNOSIS — Z9889 Other specified postprocedural states: Secondary | ICD-10-CM | POA: Insufficient documentation

## 2023-10-10 DIAGNOSIS — E7800 Pure hypercholesterolemia, unspecified: Secondary | ICD-10-CM | POA: Diagnosis present

## 2023-10-10 LAB — VAS US ABI WITH/WO TBI
Left ABI: 0.78
Right ABI: 0.45

## 2023-10-12 NOTE — Progress Notes (Signed)
 Moderate to severe stenosis right and mild to moderate on left overall stable from 08/17/21 and in fact appears to have improved in severity stenosis especially right. Recheck in 1 year

## 2023-10-21 IMAGING — MG MM DIGITAL SCREENING BILAT W/ TOMO AND CAD
8 series · 8 of 24 positions shown · non-contrast
Comparison: Previous exam(s).

CLINICAL DATA: Screening.

EXAM:
DIGITAL SCREENING BILATERAL MAMMOGRAM WITH TOMOSYNTHESIS AND CAD
TECHNIQUE: Bilateral screening digital craniocaudal and mediolateral oblique
mammograms were obtained. Bilateral screening digital breast
tomosynthesis was performed. The images were evaluated with
computer-aided detection.

[L CC synth-2D]
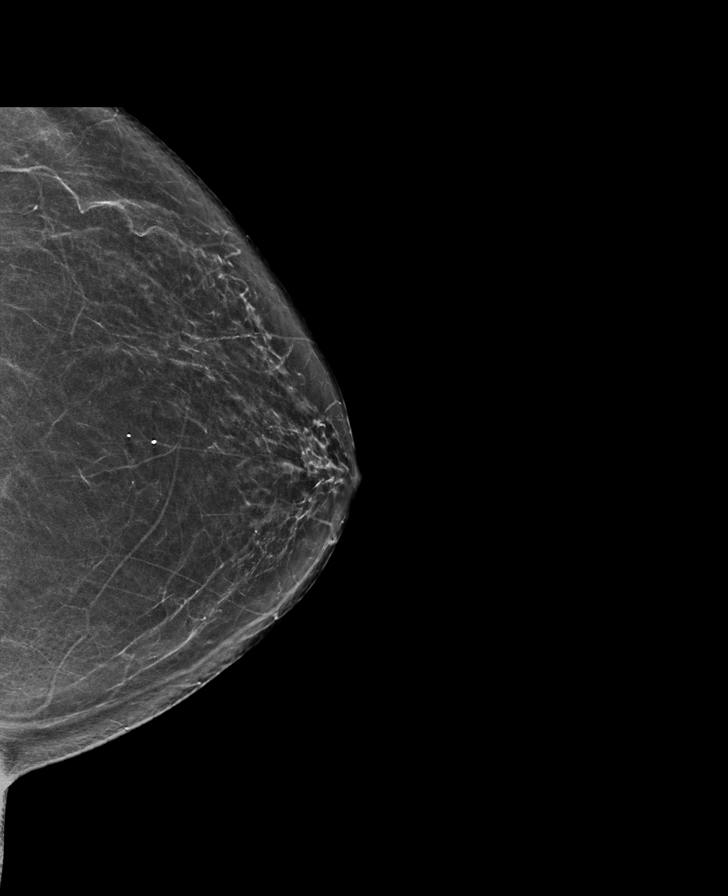

[R MLO synth-2D]
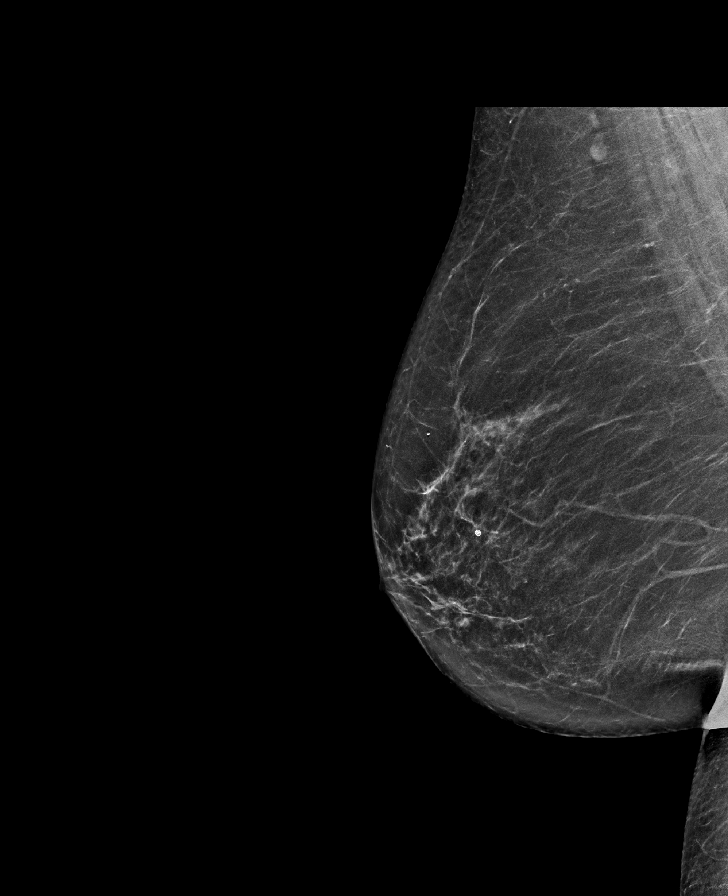

[L MLO synth-2D]
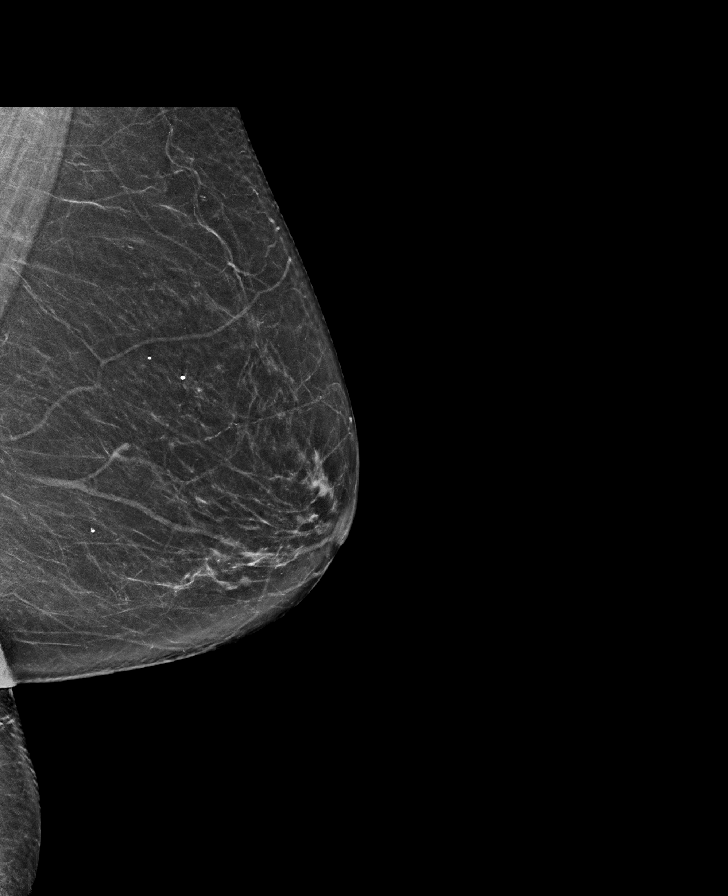

[R CC synth-2D]
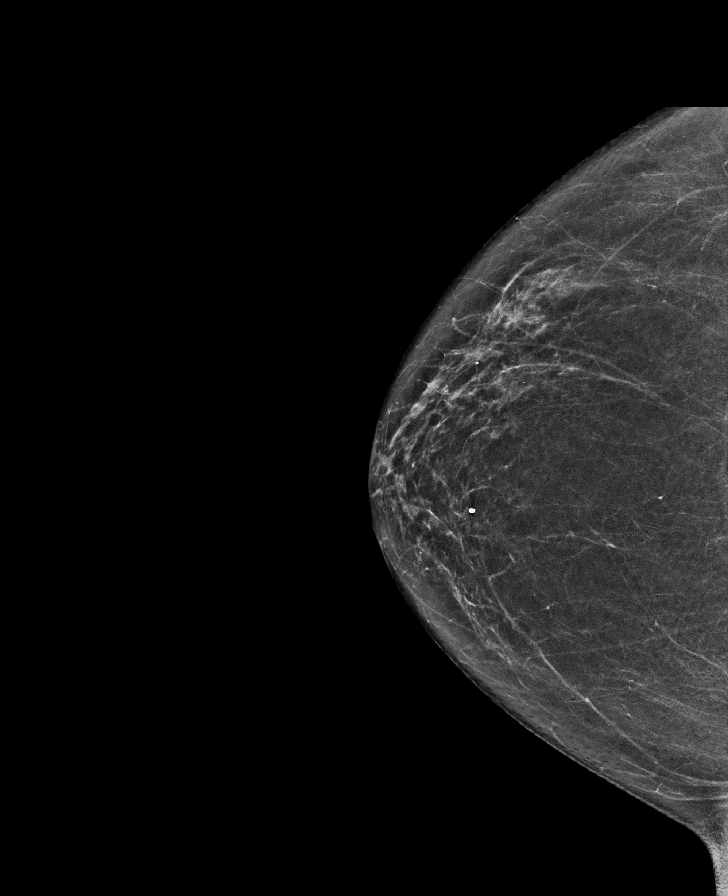

[L CC tomo · tomo slice 34/67.0]
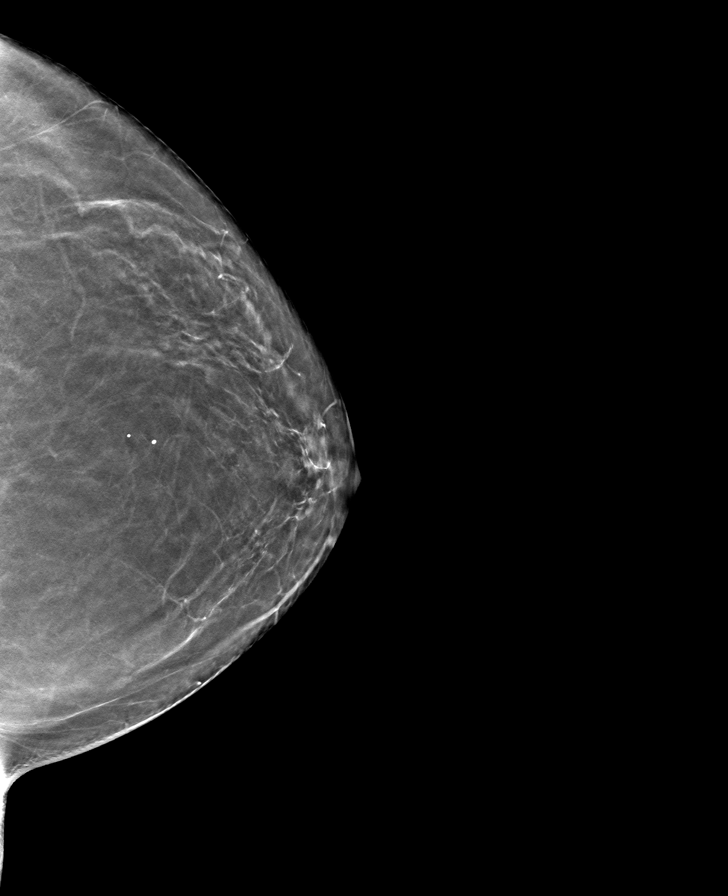

[R MLO tomo · tomo slice 35/70.0]
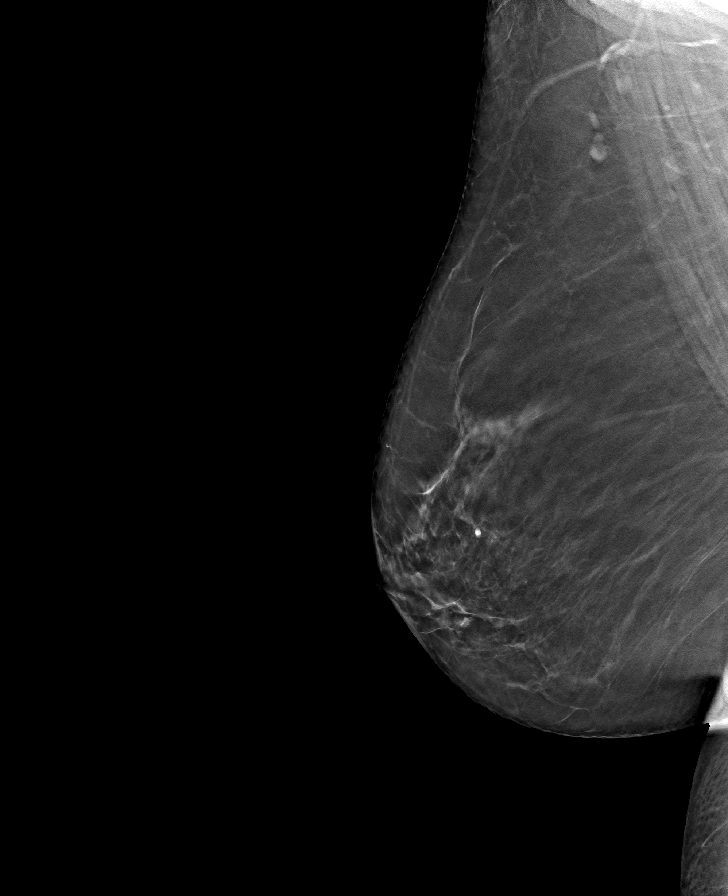

[R CC tomo · tomo slice 33/64.0]
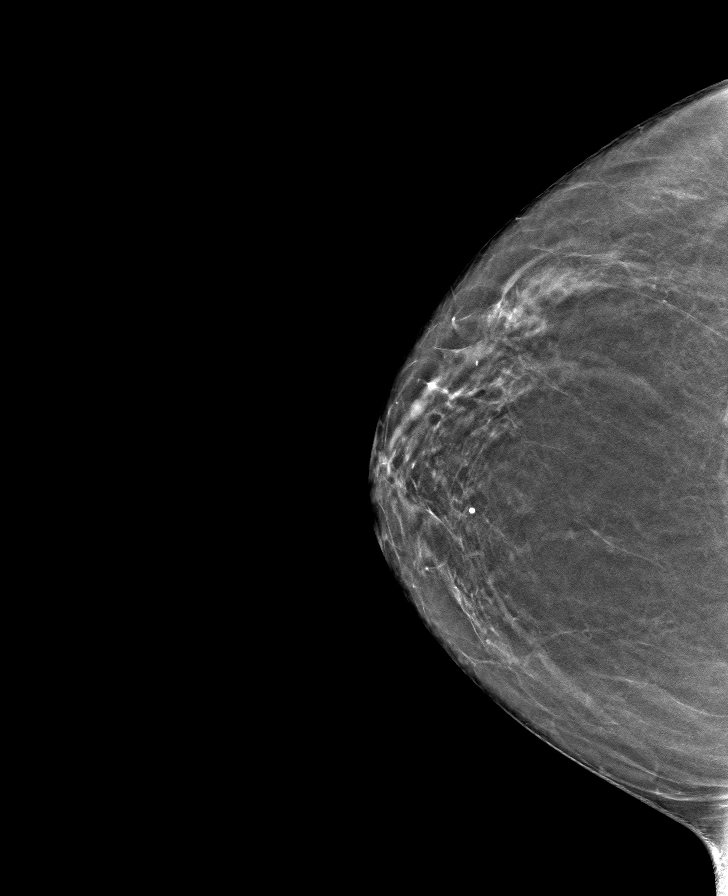

[L MLO tomo · tomo slice 35/68.0]
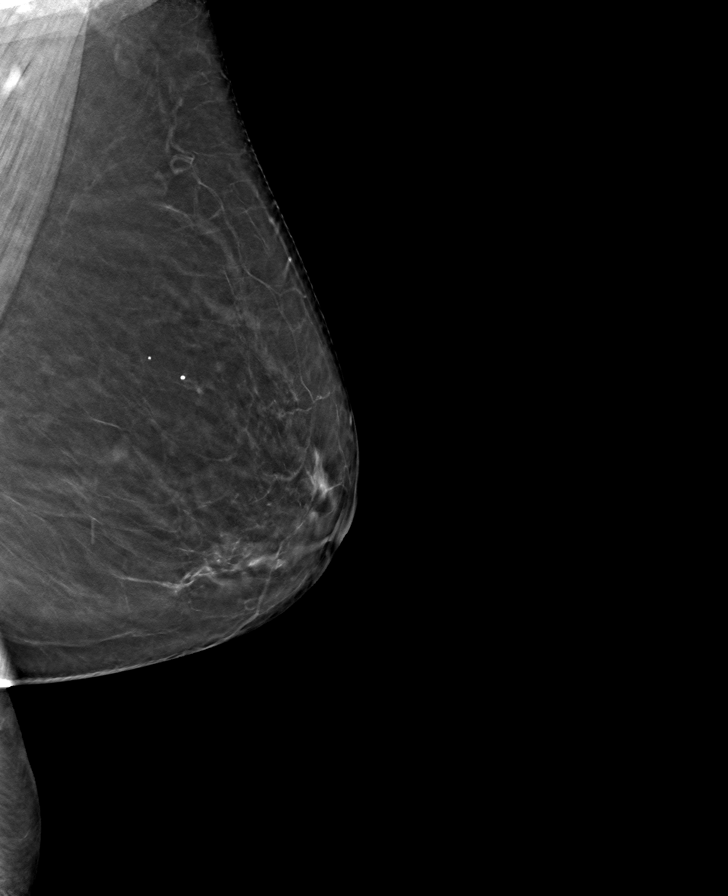

[8 of 24 positions shown; findings below may reference images not displayed]

ACR Breast Density Category b: There are scattered areas of
fibroglandular density.
FINDINGS: There are no findings suspicious for malignancy.
IMPRESSION: No mammographic evidence of malignancy. A result letter of this
screening mammogram will be mailed directly to the patient.

RECOMMENDATION:
Screening mammogram in one year. (Code:51-O-LD2)

BI-RADS CATEGORY  1: Negative.

## 2023-10-29 ENCOUNTER — Telehealth: Payer: Self-pay | Admitting: *Deleted

## 2023-10-29 NOTE — Telephone Encounter (Signed)
 LEA scheduled at [facility] for: Thursday October 31, 2023 1 PM Arrival time [facility] Main Entrance A at: 11 AM  Diet: -May have light meal until 7 AM. (6 hours before procedure time) Approved light meal consists of plain toast, fruit, light soups, crackers.  Hydration: -May drink clear liquids until 2 hours before the procedure.  Approved liquids: Water , clear tea, black coffee, fruit juices-non-citric and without pulp,Gatorade, plain Jello/popsicles.   -Please drink 16 oz bottle of water  2 hours before procedure.  Medication instructions: -Usual morning medications can be taken including aspirin  81 mg.  Plan to go home the same day, you will only stay overnight if medically necessary.  You must have responsible adult to drive you home.  Someone must be with you the first 24 hours after you arrive home.  Call placed to patient to review procedure instructions, no answer.

## 2023-10-29 NOTE — Telephone Encounter (Signed)
 Reviewed with patient and will send instructions via MyChart at patient's request.

## 2023-10-31 ENCOUNTER — Other Ambulatory Visit: Payer: Self-pay | Admitting: *Deleted

## 2023-10-31 ENCOUNTER — Encounter (HOSPITAL_COMMUNITY)
Admission: RE | Disposition: A | Discharge status: Home / Self Care | Payer: Self-pay | Source: Home / Self Care | Attending: Cardiology

## 2023-10-31 ENCOUNTER — Ambulatory Visit (HOSPITAL_COMMUNITY)
Admission: RE | Admit: 2023-10-31 | Discharge: 2023-10-31 | Disposition: A | Discharge status: Home / Self Care | Attending: Cardiology | Admitting: Cardiology

## 2023-10-31 ENCOUNTER — Other Ambulatory Visit: Payer: Self-pay

## 2023-10-31 DIAGNOSIS — I6521 Occlusion and stenosis of right carotid artery: Secondary | ICD-10-CM | POA: Diagnosis not present

## 2023-10-31 DIAGNOSIS — I70213 Atherosclerosis of native arteries of extremities with intermittent claudication, bilateral legs: Secondary | ICD-10-CM

## 2023-10-31 DIAGNOSIS — Z87891 Personal history of nicotine dependence: Secondary | ICD-10-CM | POA: Insufficient documentation

## 2023-10-31 DIAGNOSIS — I701 Atherosclerosis of renal artery: Secondary | ICD-10-CM | POA: Diagnosis not present

## 2023-10-31 DIAGNOSIS — I1A0 Resistant hypertension: Secondary | ICD-10-CM | POA: Insufficient documentation

## 2023-10-31 DIAGNOSIS — I70211 Atherosclerosis of native arteries of extremities with intermittent claudication, right leg: Secondary | ICD-10-CM | POA: Diagnosis present

## 2023-10-31 DIAGNOSIS — I739 Peripheral vascular disease, unspecified: Secondary | ICD-10-CM

## 2023-10-31 DIAGNOSIS — R0609 Other forms of dyspnea: Secondary | ICD-10-CM | POA: Insufficient documentation

## 2023-10-31 DIAGNOSIS — I150 Renovascular hypertension: Secondary | ICD-10-CM | POA: Insufficient documentation

## 2023-10-31 HISTORY — PX: ABDOMINAL AORTOGRAM W/LOWER EXTREMITY: CATH118223

## 2023-10-31 HISTORY — PX: LOWER EXTREMITY ANGIOGRAPHY: CATH118251

## 2023-10-31 SURGERY — ABDOMINAL AORTOGRAM W/LOWER EXTREMITY
Anesthesia: LOCAL

## 2023-10-31 MED ORDER — LIDOCAINE HCL (PF) 1 % IJ SOLN
INTRAMUSCULAR | Status: DC | PRN
  Administered 2023-10-31: 12 mL

## 2023-10-31 MED ORDER — FREE WATER: 500.0000 mL | Freq: Once | Status: DC

## 2023-10-31 MED ORDER — SODIUM CHLORIDE 0.9% FLUSH: 3.0000 mL | Freq: Two times a day (BID) | INTRAVENOUS | Status: DC

## 2023-10-31 MED ORDER — HEPARIN (PORCINE) IN NACL 1000-0.9 UT/500ML-% IV SOLN
INTRAVENOUS | Status: DC | PRN
  Administered 2023-10-31 (×2): 500 mL via INTRA_ARTERIAL

## 2023-10-31 MED ORDER — LIDOCAINE HCL (PF) 1 % IJ SOLN
INTRAMUSCULAR | Status: AC
  Filled 2023-10-31: qty 30

## 2023-10-31 MED ORDER — MIDAZOLAM HCL 2 MG/2ML IJ SOLN
INTRAMUSCULAR | Status: AC
  Filled 2023-10-31: qty 2

## 2023-10-31 MED ORDER — ASPIRIN 81 MG PO CHEW: 81.0000 mg | CHEWABLE_TABLET | ORAL | Status: DC

## 2023-10-31 MED ORDER — IOHEXOL 350 MG/ML SOLN
INTRAVENOUS | Status: DC | PRN
  Administered 2023-10-31: 45 mL via INTRA_ARTERIAL

## 2023-10-31 MED ORDER — SODIUM CHLORIDE 0.9% FLUSH: 3.0000 mL | INTRAVENOUS | Status: DC | PRN

## 2023-10-31 MED ORDER — MIDAZOLAM HCL 2 MG/2ML IJ SOLN
INTRAMUSCULAR | Status: DC | PRN
  Administered 2023-10-31: 2 mg via INTRAVENOUS

## 2023-10-31 MED ORDER — FENTANYL CITRATE (PF) 100 MCG/2ML IJ SOLN
INTRAMUSCULAR | Status: AC
  Filled 2023-10-31: qty 2

## 2023-10-31 MED ORDER — FENTANYL CITRATE (PF) 100 MCG/2ML IJ SOLN
INTRAMUSCULAR | Status: DC | PRN
  Administered 2023-10-31: 25 ug via INTRAVENOUS

## 2023-10-31 MED ORDER — SODIUM CHLORIDE 0.9 % IV SOLN: 250.0000 mL | INTRAVENOUS | Status: DC | PRN

## 2023-10-31 SURGICAL SUPPLY — 13 items
CATH CROSS OVER TEMPO 5F (CATHETERS) IMPLANT
CATH INFINITI 5FR ANG PIGTAIL (CATHETERS) IMPLANT
CLOSURE MYNX CONTROL 5F (Vascular Products) IMPLANT
GUIDEWIRE ANGLED .035X150CM (WIRE) IMPLANT
KIT MICROPUNCTURE NIT STIFF (SHEATH) IMPLANT
KIT SINGLE USE MANIFOLD (KITS) IMPLANT
PACK CARDIAC CATHETERIZATION (CUSTOM PROCEDURE TRAY) IMPLANT
SET ATX-X65L (MISCELLANEOUS) IMPLANT
SHEATH PINNACLE 5F 10CM (SHEATH) IMPLANT
SHEATH PROBE COVER 6X72 (BAG) IMPLANT
TRANSDUCER W/STOPCOCK (MISCELLANEOUS) ×1 IMPLANT
WIRE HI TORQ VERSACORE 300 (WIRE) IMPLANT
WIRE MICROINTRODUCER 60CM (WIRE) IMPLANT

## 2023-10-31 NOTE — Interval H&P Note (Signed)
 History and Physical Interval Note:  10/31/2023 1:16 PM  Karina [name]  has presented today for surgery, with the diagnosis of pad.  The various methods of treatment have been discussed with the patient and family. After consideration of risks, benefits and other options for treatment, the patient has consented to  Procedure(s): Lower Extremity Angiography (N/A) and possible angioplasty as a surgical intervention.  The patient's history has been reviewed, patient examined, no change in status, stable for surgery.  I have reviewed the patient's chart and labs.  Questions were answered to the patient's satisfaction.     Gordy Bergamo

## 2023-10-31 NOTE — H&P (Signed)
 Cardiology Office Note:  .   Date:  10/31/2023  ID:  Karina [name], Karina [name] Oct 09, 1950, MRN [id number] PCP: Leila Lucie LABOR, MD  [facility] Providers Cardiologist:  Wilbert Bihari, MD   History of Present Illness: .   Karina [name] is a 73 y.o. female patient with longstanding history of tobacco use disorder quit sometime in 2015, resistant hypertension ongoing for several years, renovascular pretension, peripheral arterial disease with high-grade right CFA stenosis noted by angiography on 01/24/2021, asymptomatic high-grade right ICA stenosis and chronic dyspnea on exertion.   She underwent renal arteriogram and also peripheral lower extremity arteriogram on 01/24/2021 and found to have high-grade right renal artery stenosis for which he underwent direct stenting with excellent results.  She was also found to have an ulcerated high-grade right common femoral artery 95% stenosis.  She has been referred to vascular surgery for endarterectomy and was seen by Dr. Serene, however appears that surgery never happened, patient decided not to pursue this.  She now presents with worsening symptoms of claudication involving the right leg, gait difficulty.  She experiences persistent numbness and occasional cramping in the right foot, which is relieved by repositioning. A clicking sound occurs in the joint when the cramp resolves. Cold sensations are also present in the right foot.  Arterial blockage in the right leg was previously identified, and surgery was recommended but not pursued due to concerns about complications. Her symptoms have led to decreased physical activity over the past two years, as she avoids walking to prevent discomfort and limits activity due to fear of cramping. She has remained abstinent from smoking Labs   External Labs:  Care everywhere labs 12/13/2022:  Total cholesterol 191, triglycerides 108, HDL 66, LDL 105.  A1c 5.7%.  TSH normal at 1.393.  Sodium 135,  potassium 4.7, BUN 15, creatinine 0.99, EGFR 60 mL.  Hb 14.0/HCT 42.1, platelets 267.  ROS  Review of Systems  Cardiovascular:  Positive for claudication (right leg). Negative for chest pain, dyspnea on exertion and leg swelling.   Physical Exam:   VS:  BP (!) 161/56   Pulse 71   Temp 98.1 F (36.7 C) (Oral)   Resp 14   Ht 5' (1.524 m)   Wt 59 kg   SpO2 95%   BMI 25.39 kg/m    Wt Readings from Last 3 Encounters:  10/31/23 59 kg  09/30/23 59.3 kg  08/21/21 59.2 kg    Physical Exam Neck:     Vascular: No JVD.  Cardiovascular:     Rate and Rhythm: Normal rate and regular rhythm.     Pulses:          Carotid pulses are  on the right side with bruit and  on the left side with bruit.      Femoral pulses are 1+ on the right side with bruit and 2+ on the left side with bruit.      Dorsalis pedis pulses are 0 on the right side and 2+ on the left side.       Posterior tibial pulses are 0 on the right side and 2+ on the left side.     Heart sounds: Normal heart sounds. No murmur heard.    No gallop.  Pulmonary:     Effort: Pulmonary effort is normal.     Breath sounds: Normal breath sounds.  Abdominal:     General: Bowel sounds are normal.     Palpations: Abdomen is soft.  Musculoskeletal:  Right lower leg: No edema.     Left lower leg: No edema.    Studies Reviewed: SABRA     Peripheral arteriogram/renal arteriogram and angioplasty 01/24/2021: Abdominal aorta reveals no significant disease, 2 renal arteries on either sides.  Right renal artery ostium has a 90% stenosis, left renal artery is widely patent. Right common femoral artery in the proximal and mid segment has a 90 to 95% ulcerated stenosis with heavy plaque burden.  Otherwise the right SFA and below the knee there is three-vessel runoff with minimal disease. Left common femoral artery is widely patent, left proximal SFA at the origin has a 80% stenosis.  Mid to distal segment has focal about 40 mm to 60 mm of 70 to  80% stenosis.  Three-vessel runoff below the left knee with minimal disease.  Successful direct stenting of the right renal artery ostium with implantation of a 5.0 x 15 mm Herculink balloon expandable stent, stenosis reduced from 90% to 0%, pressure gradient reduced from >154mmHg to 0 mmHg.   Carotid artery duplex 08/17/2021: Duplex suggests stenosis in the right internal carotid artery (>=70%). Duplex suggests stenosis in the right external carotid artery (<50%). The right PSV internal/common carotid artery ratio of 6.32 is consistent with a stenosis of >70%. Velocity 461/112 cm/s. Duplex suggests stenosis in the left internal carotid artery (1-15%). Duplex suggests stenosis in the left external carotid artery (<50%). Antegrade right vertebral artery flow. Antegrade left vertebral artery flow. Compared to the study done on 01/12/2021, right ICA stenosis has progressed from <70%. Follow up in six months is appropriate if clinically indicated.  Renal artery duplex  08/17/2021: No evidence of renal artery occlusive disease in either renal artery. Bilateral kidney appears small and measures with increased resistivity index suggesting medico renal disease Moderate diffuse plaque noted in the abdominal aorta. Left renal artery stent appears patent.   Lower Extremity Arterial Duplex 10/10/2023: Right: Severe progression is noted compared to previous study. Diffuse  atherosclerosis.  50-74% stenosis in the mid and distal CFA.  50-74% stenosis in the DFA.  50-74% stenosis at the ostial SFA.  Total occlusion in the proximal SFA distal to ostium, and mid SFA.  ABI: .45 on the right and .78 on the left  EKG:         Medications ordered    Meds ordered this encounter  Medications   aspirin  chewable tablet 81 mg   free water  500 mL   sodium chloride  flush (NS) 0.9 % injection 3 mL   sodium chloride  flush (NS) 0.9 % injection 3 mL   0.9 %  sodium chloride  infusion     ASSESSMENT AND PLAN:  .   Peripheral artery disease with claudication Chronic peripheral artery disease with claudication in the right leg, likely due to right femoral artery blockage. Symptoms include numbness and cramping, suggesting possible disease progression. Previous elective surgery was not pursued due to fear of complications. Risks of bleeding, infection, and need for emergent surgery, with less than 1% risk, were discussed. -  Will schedule for peripheral arteriogram and possible angioplasty given symptoms. Patient understands the risks, benefits, alternatives including medical therapy, CT angiography. Patient understands <1-2% risk of death, embolic complications, bleeding, infection, renal failure, urgent surgical revascularization, but not limited to these and wants to proceed. Signed,  Gordy Bergamo, MD, [facility] 10/31/2023, 1:13 PM [facility] [address] [place], [place] [address] Phone: [phone]. Fax:  [phone]

## 2023-10-31 NOTE — Discharge Instructions (Signed)
 Femoral Site Care This sheet gives you information about how to care for yourself after your procedure. Your health care provider may also give you more specific instructions. If you have problems or questions, contact your health care provider. What can I expect after the procedure?  After the procedure, it is common to have: Bruising that usually fades within 1-2 weeks. Tenderness at the site. Follow these instructions at home: Wound care Follow instructions from your health care provider about how to take care of your insertion site. Make sure you: Wash your hands with soap and water before you change your bandage (dressing). If soap and water are not available, use hand sanitizer. Remove your dressing as told by your health care provider. In 24 hours Do not take baths, swim, or use a hot tub until your health care provider approves. You may shower 24-48 hours after the procedure or as told by your health care provider. Gently wash the site with plain soap and water. Pat the area dry with a clean towel. Do not rub the site. This may cause bleeding. Do not apply powder or lotion to the site. Keep the site clean and dry. Check your femoral site every day for signs of infection. Check for: Redness, swelling, or pain. Fluid or blood. Warmth. Pus or a bad smell. Activity For the first 2-3 days after your procedure, or as long as directed: Avoid climbing stairs as much as possible. Do not squat. Do not lift anything that is heavier than 10 lb (4.5 kg), or the limit that you are told, until your health care provider says that it is safe. For 5 days Rest as directed. Avoid sitting for a long time without moving. Get up to take short walks every 1-2 hours. Do not drive for 24 hours if you were given a medicine to help you relax (sedative). General instructions Take over-the-counter and prescription medicines only as told by your health care provider. Keep all follow-up visits as told by  your health care provider. This is important. Contact a health care provider if you have: A fever or chills. You have redness, swelling, or pain around your insertion site. Get help right away if: The catheter insertion area swells very fast. You pass out. You suddenly start to sweat or your skin gets clammy. The catheter insertion area is bleeding, and the bleeding does not stop when you hold steady pressure on the area. The area near or just beyond the catheter insertion site becomes pale, cool, tingly, or numb. These symptoms may represent a serious problem that is an emergency. Do not wait to see if the symptoms will go away. Get medical help right away. Call your local emergency services (911 in the [place] Do not drive yourself to the hospital. Summary After the procedure, it is common to have bruising that usually fades within 1-2 weeks. Check your femoral site every day for signs of infection. Do not lift anything that is heavier than 10 lb (4.5 kg), or the limit that you are told, until your health care provider says that it is safe. This information is not intended to replace advice given to you by your health care provider. Make sure you discuss any questions you have with your health care provider. Document Revised: 03/04/2017 Document Reviewed: 03/04/2017 Elsevier Patient Education  2020 [employer].

## 2023-10-31 NOTE — Progress Notes (Signed)
 Patient ambulated in the hall to the bathroom. Left femoral site dressing clean, dry, and intact. No hematoma noted.

## 2023-11-01 ENCOUNTER — Encounter (HOSPITAL_COMMUNITY): Payer: Self-pay | Admitting: Cardiology

## 2023-12-05 ENCOUNTER — Ambulatory Visit: Admitting: Cardiology

## 2023-12-12 ENCOUNTER — Other Ambulatory Visit: Payer: Self-pay

## 2023-12-12 DIAGNOSIS — E785 Hyperlipidemia, unspecified: Secondary | ICD-10-CM

## 2023-12-16 ENCOUNTER — Ambulatory Visit: Attending: Surgery | Admitting: Surgery

## 2023-12-16 ENCOUNTER — Other Ambulatory Visit: Payer: Self-pay

## 2023-12-16 ENCOUNTER — Encounter: Payer: Self-pay | Admitting: Surgery

## 2023-12-16 VITALS — BP 172/68 | HR 85 | Temp 98.0°F | Ht 60.0 in | Wt 132.0 lb

## 2023-12-16 DIAGNOSIS — I70202 Unspecified atherosclerosis of native arteries of extremities, left leg: Secondary | ICD-10-CM

## 2023-12-16 DIAGNOSIS — I6521 Occlusion and stenosis of right carotid artery: Secondary | ICD-10-CM

## 2023-12-16 NOTE — Progress Notes (Signed)
 [facility] [place]  Patient name: Karina [name] MRN: [id number] DOB: 05-Dec-1950 Sex: female   REASON FOR VISIT:    Follow-up  HISOTRY OF PRESENT ILLNESS:    Karina [name] is a 73 y.o. female who I saw in 2023 for evaluation of right leg claudication.  At that time she was having buttock and calf cramping after approximately 100 steps.  She was without rest pain or ulcers.  Her ABI was 0.32 on the right.  She was found to have a high-grade approximately 90% right common femoral stenosis.  I discussed revascularization however she elected not to proceed at that time.  She is now having worsening symptoms, which have led to decreased physical activity and avoiding walking to avoid discomfort.  She underwent repeat angiography by Dr. Ladona on 10/31/2023.  She is referred back for femoral endarterectomy  Patient has a history of renal artery stenosis, treated with stenting. She is on a statin for hypercholesterolemia. She is medically managed for hypertension. She does suffer from hearing loss.  PAST MEDICAL HISTORY:   Past Medical History:  Diagnosis Date   Hearing loss    History of echocardiogram    Echo 1/17 - EF 60-65%, normal wall motion, grade 1 diastolic dysfunction, LA upper limits of normal, moderate TR, PASP 41 mmHg   History of nuclear stress test    Myoview 4/17 - no ischemia, EF 66%   HLD (hyperlipidemia)    Hypertension    Hypothyroidism    thyroid  problem unable to explain     FAMILY HISTORY:   Family History  Problem Relation Age of Onset   Stroke Mother    Diabetes Mother    Hypertension Mother     SOCIAL HISTORY:   Social History   Tobacco Use   Smoking status: Former    Current packs/day: 0.00    Average packs/day: 1 pack/day for 30.0 years (30.0 ttl pk-yrs)    Types: Cigarettes    Start date: 01/22/1985    Quit date: 01/23/2015    Years since quitting: 8.9   Smokeless tobacco: Never   Substance Use Topics   Alcohol use: No    Alcohol/week: 0.0 standard drinks of alcohol     ALLERGIES:   Allergies  Allergen Reactions   Maxzide  [Hydrochlorothiazide -Triamterene ] Other (See Comments)    Severe hyponatremia with med.   Hydralazine      Pt does not know reaction      CURRENT MEDICATIONS:   Current Outpatient Medications  Medication Sig Dispense Refill   amLODipine  (NORVASC ) 10 MG tablet Take 1 tablet (10 mg total) by mouth in the morning. 90 tablet 3   aspirin  (ASPIRIN  CHILDRENS) 81 MG chewable tablet Chew 1 tablet (81 mg total) by mouth daily.     atenolol  (TENORMIN ) 25 MG tablet Take 0.5 tablets (12.5 mg total) by mouth daily as needed (Elevated Heart Rate/Palpitations).     levothyroxine  (SYNTHROID ) 100 MCG tablet Take 1 tablet (100 mcg total) by mouth daily before breakfast. 90 tablet 3   Multiple Vitamins-Minerals (MULTIVITAMIN WITH MINERALS) tablet Take 1 tablet by mouth daily.     pravastatin  (PRAVACHOL ) 20 MG tablet Take 1 tablet (20 mg total) by mouth daily. Reported on 06/18/2015 90 tablet 3   telmisartan  (MICARDIS ) 20 MG tablet Take 1 tablet (20 mg total) by mouth every evening. 90 tablet 3   No current facility-administered medications for this visit.    REVIEW OF SYSTEMS:   [X]  denotes positive finding, [ ]   denotes negative finding Cardiac  Comments:  Chest pain or chest pressure:    Shortness of breath upon exertion:    Short of breath when lying flat:    Irregular heart rhythm:        Vascular    Pain in calf, thigh, or hip brought on by ambulation:    Pain in feet at night that wakes you up from your sleep:     Blood clot in your veins:    Leg swelling:         Pulmonary    Oxygen at home:    Productive cough:     Wheezing:         Neurologic    Sudden weakness in arms or legs:     Sudden numbness in arms or legs:     Sudden onset of difficulty speaking or slurred speech:    Temporary loss of vision in one eye:     Problems with  dizziness:         Gastrointestinal    Blood in stool:     Vomited blood:         Genitourinary    Burning when urinating:     Blood in urine:        Psychiatric    Major depression:         Hematologic    Bleeding problems:    Problems with blood clotting too easily:        Skin    Rashes or ulcers:        Constitutional    Fever or chills:      PHYSICAL EXAM:   There were no vitals filed for this visit.  GENERAL: The patient is a well-nourished female, in no acute distress. The vital signs are documented above. CARDIAC: There is a regular rate and rhythm.  PULMONARY: Non-labored respirations ABDOMEN: Soft and non-tender   MUSCULOSKELETAL: There are no major deformities or cyanosis. NEUROLOGIC: No focal weakness or paresthesias are detected. SKIN: There are no ulcers or rashes noted. PSYCHIATRIC: The patient has a normal affect.  STUDIES:   The following: Carotid: Right Carotid: Velocities in the right ICA are consistent with a 60-79%                 stenosis.   Left Carotid: Velocities in the left ICA are consistent with a 40-59%  stenosis.               LICA stenosis based on peak systolic velocities and plaque                formation.   Vertebrals:  Bilateral vertebral arteries demonstrate antegrade flow.  Subclavians: Left subclavian artery was stenotic. Normal flow hemodynamics  were              seen in the right subclavian artery.   +-------+-----------+-----------+------------+------------+  ABI/TBIToday's ABIToday's TBIPrevious ABIPrevious TBI  +-------+-----------+-----------+------------+------------+  Right .45        0.0        .32         0.0           +-------+-----------+-----------+------------+------------+  Left  .78        .38        .76         .27           +-------+-----------+-----------+------------+--------   MEDICAL ISSUES:   Right leg claudication: The patient has a greater than 90% common femoral stenosis  and an occluded right proximal superficial femoral artery.  We discussed proceeding with right femoral endarterectomy.  If she continues to have issues, her proximal SFA occlusion can be addressed percutaneously.  She is here today with her daughter.  We answered all of her questions including the length of surgery, the anticipated recovery.  And risk of complication.  All of her questions were answered.  We should get this scheduled in the near future.  This will be a right femoral endarterectomy.    Malvina Serene CLORE, MD, FACS [facility] [phone] Pager [phone]

## 2023-12-16 NOTE — Telephone Encounter (Signed)
 Pt of Dr. Ladona. These RX's were last refilled in 2022. In last OV, I don't see any mention of these RX's. Please advise.

## 2023-12-16 NOTE — H&P (View-Only) (Signed)
 [facility] [place]  Patient name: Karina [name] MRN: [id number] DOB: 05-Dec-1950 Sex: female   REASON FOR VISIT:    Follow-up  HISOTRY OF PRESENT ILLNESS:    Karina [name] is a 73 y.o. female who I saw in 2023 for evaluation of right leg claudication.  At that time she was having buttock and calf cramping after approximately 100 steps.  She was without rest pain or ulcers.  Her ABI was 0.32 on the right.  She was found to have a high-grade approximately 90% right common femoral stenosis.  I discussed revascularization however she elected not to proceed at that time.  She is now having worsening symptoms, which have led to decreased physical activity and avoiding walking to avoid discomfort.  She underwent repeat angiography by Dr. Ladona on 10/31/2023.  She is referred back for femoral endarterectomy  Patient has a history of renal artery stenosis, treated with stenting. She is on a statin for hypercholesterolemia. She is medically managed for hypertension. She does suffer from hearing loss.  PAST MEDICAL HISTORY:   Past Medical History:  Diagnosis Date   Hearing loss    History of echocardiogram    Echo 1/17 - EF 60-65%, normal wall motion, grade 1 diastolic dysfunction, LA upper limits of normal, moderate TR, PASP 41 mmHg   History of nuclear stress test    Myoview 4/17 - no ischemia, EF 66%   HLD (hyperlipidemia)    Hypertension    Hypothyroidism    thyroid  problem unable to explain     FAMILY HISTORY:   Family History  Problem Relation Age of Onset   Stroke Mother    Diabetes Mother    Hypertension Mother     SOCIAL HISTORY:   Social History   Tobacco Use   Smoking status: Former    Current packs/day: 0.00    Average packs/day: 1 pack/day for 30.0 years (30.0 ttl pk-yrs)    Types: Cigarettes    Start date: 01/22/1985    Quit date: 01/23/2015    Years since quitting: 8.9   Smokeless tobacco: Never   Substance Use Topics   Alcohol use: No    Alcohol/week: 0.0 standard drinks of alcohol     ALLERGIES:   Allergies  Allergen Reactions   Maxzide  [Hydrochlorothiazide -Triamterene ] Other (See Comments)    Severe hyponatremia with med.   Hydralazine      Pt does not know reaction      CURRENT MEDICATIONS:   Current Outpatient Medications  Medication Sig Dispense Refill   amLODipine  (NORVASC ) 10 MG tablet Take 1 tablet (10 mg total) by mouth in the morning. 90 tablet 3   aspirin  (ASPIRIN  CHILDRENS) 81 MG chewable tablet Chew 1 tablet (81 mg total) by mouth daily.     atenolol  (TENORMIN ) 25 MG tablet Take 0.5 tablets (12.5 mg total) by mouth daily as needed (Elevated Heart Rate/Palpitations).     levothyroxine  (SYNTHROID ) 100 MCG tablet Take 1 tablet (100 mcg total) by mouth daily before breakfast. 90 tablet 3   Multiple Vitamins-Minerals (MULTIVITAMIN WITH MINERALS) tablet Take 1 tablet by mouth daily.     pravastatin  (PRAVACHOL ) 20 MG tablet Take 1 tablet (20 mg total) by mouth daily. Reported on 06/18/2015 90 tablet 3   telmisartan  (MICARDIS ) 20 MG tablet Take 1 tablet (20 mg total) by mouth every evening. 90 tablet 3   No current facility-administered medications for this visit.    REVIEW OF SYSTEMS:   [X]  denotes positive finding, [ ]   denotes negative finding Cardiac  Comments:  Chest pain or chest pressure:    Shortness of breath upon exertion:    Short of breath when lying flat:    Irregular heart rhythm:        Vascular    Pain in calf, thigh, or hip brought on by ambulation:    Pain in feet at night that wakes you up from your sleep:     Blood clot in your veins:    Leg swelling:         Pulmonary    Oxygen at home:    Productive cough:     Wheezing:         Neurologic    Sudden weakness in arms or legs:     Sudden numbness in arms or legs:     Sudden onset of difficulty speaking or slurred speech:    Temporary loss of vision in one eye:     Problems with  dizziness:         Gastrointestinal    Blood in stool:     Vomited blood:         Genitourinary    Burning when urinating:     Blood in urine:        Psychiatric    Major depression:         Hematologic    Bleeding problems:    Problems with blood clotting too easily:        Skin    Rashes or ulcers:        Constitutional    Fever or chills:      PHYSICAL EXAM:   There were no vitals filed for this visit.  GENERAL: The patient is a well-nourished female, in no acute distress. The vital signs are documented above. CARDIAC: There is a regular rate and rhythm.  PULMONARY: Non-labored respirations ABDOMEN: Soft and non-tender   MUSCULOSKELETAL: There are no major deformities or cyanosis. NEUROLOGIC: No focal weakness or paresthesias are detected. SKIN: There are no ulcers or rashes noted. PSYCHIATRIC: The patient has a normal affect.  STUDIES:   The following: Carotid: Right Carotid: Velocities in the right ICA are consistent with a 60-79%                 stenosis.   Left Carotid: Velocities in the left ICA are consistent with a 40-59%  stenosis.               LICA stenosis based on peak systolic velocities and plaque                formation.   Vertebrals:  Bilateral vertebral arteries demonstrate antegrade flow.  Subclavians: Left subclavian artery was stenotic. Normal flow hemodynamics  were              seen in the right subclavian artery.   +-------+-----------+-----------+------------+------------+  ABI/TBIToday's ABIToday's TBIPrevious ABIPrevious TBI  +-------+-----------+-----------+------------+------------+  Right .45        0.0        .32         0.0           +-------+-----------+-----------+------------+------------+  Left  .78        .38        .76         .27           +-------+-----------+-----------+------------+--------   MEDICAL ISSUES:   Right leg claudication: The patient has a greater than 90% common femoral stenosis  and an occluded right proximal superficial femoral artery.  We discussed proceeding with right femoral endarterectomy.  If she continues to have issues, her proximal SFA occlusion can be addressed percutaneously.  She is here today with her daughter.  We answered all of her questions including the length of surgery, the anticipated recovery.  And risk of complication.  All of her questions were answered.  We should get this scheduled in the near future.  This will be a right femoral endarterectomy.    Malvina Serene CLORE, MD, FACS [facility] [phone] Pager [phone]

## 2023-12-18 MED ORDER — PRAVASTATIN SODIUM 20 MG PO TABS: 20.0000 mg | ORAL_TABLET | Freq: Every day | ORAL | 3 refills | Status: AC

## 2023-12-18 NOTE — Telephone Encounter (Signed)
 Okay to refill Prava but send Levo to PCP

## 2023-12-23 ENCOUNTER — Ambulatory Visit: Admitting: Surgery

## 2023-12-27 ENCOUNTER — Encounter (HOSPITAL_COMMUNITY): Payer: Self-pay

## 2023-12-27 NOTE — Pre-Procedure Instructions (Signed)
 Surgical Instructions   Your procedure is scheduled on January 01, 2024. Report to [facility] Main Entrance A at 5:30 A.M., then check in with the Admitting office. Any questions or running late day of surgery: call [phone]  Questions prior to your surgery date: call [phone], Monday-Friday, 8am-4pm. If you experience any cold or flu symptoms such as cough, fever, chills, shortness of breath, etc. between now and your scheduled surgery, please notify us  at the above number.     Remember:  Do not eat or drink after midnight the night before your surgery    Take these medicines the morning of surgery with A SIP OF WATER : levothyroxine  (SYNTHROID )  atenolol  (TENORMIN )    One week prior to surgery, STOP taking any Aleve, Naproxen, Ibuprofen, Motrin, Advil, Goody's, BC's, all herbal medications, fish oil, and non-prescription vitamins.                     Do NOT Smoke (Tobacco/Vaping) for 24 hours prior to your procedure.  If you use a CPAP at night, you may bring your mask/headgear for your overnight stay.   You will be asked to remove any contacts, glasses, piercing's, hearing aid's, dentures/partials prior to surgery. Please bring cases for these items if needed.    Patients discharged the day of surgery will not be allowed to drive home, and someone needs to stay with them for 24 hours.  SURGICAL WAITING ROOM VISITATION Patients may have no more than 2 support people in the waiting area - these visitors may rotate.   Pre-op nurse will coordinate an appropriate time for 1 ADULT support person, who may not rotate, to accompany patient in pre-op.  Children under the age of 2 must have an adult with them who is not the patient and must remain in the main waiting area with an adult.  If the patient needs to stay at the hospital during part of their recovery, the visitor guidelines for inpatient rooms apply.  Please refer to the [facility] website for the visitor  guidelines for any additional information.   If you received a COVID test during your pre-op visit  it is requested that you wear a mask when out in public, stay away from anyone that may not be feeling well and notify your surgeon if you develop symptoms. If you have been in contact with anyone that has tested positive in the last 10 days please notify you surgeon.      Pre-operative CHG Bathing Instructions   You can play a key role in reducing the risk of infection after surgery. Your skin needs to be as free of germs as possible. You can reduce the number of germs on your skin by washing with CHG (chlorhexidine  gluconate) soap before surgery. CHG is an antiseptic soap that kills germs and continues to kill germs even after washing.   DO NOT use if you have an allergy to chlorhexidine /CHG or antibacterial soaps. If your skin becomes reddened or irritated, stop using the CHG and notify one of our RNs at [phone].              TAKE A SHOWER THE NIGHT BEFORE SURGERY   Please keep in mind the following:  DO NOT shave, including legs and underarms, 48 hours prior to surgery.   You may shave your face before/day of surgery.  Place clean sheets on your bed the night before surgery Use a clean washcloth (not used since being washed) for shower.  DO NOT sleep with pet's night before surgery.  CHG Shower Instructions:  Wash your face and private area with normal soap. If you choose to wash your hair, wash first with your normal shampoo.  After you use shampoo/soap, rinse your hair and body thoroughly to remove shampoo/soap residue.  Turn the water  OFF and apply half the bottle of CHG soap to a CLEAN washcloth.  Apply CHG soap ONLY FROM YOUR NECK DOWN TO YOUR TOES (washing for 3-5 minutes)  DO NOT use CHG soap on face, private areas, open wounds, or sores.  Pay special attention to the area where your surgery is being performed.  If you are having back surgery, having someone wash your back  for you may be helpful. Wait 2 minutes after CHG soap is applied, then you may rinse off the CHG soap.  Pat dry with a clean towel  Put on clean pajamas    Additional instructions for the day of surgery: If you choose, you may shower the morning of surgery with an antibacterial soap.  DO NOT APPLY any lotions, deodorants, cologne, or perfumes.   Do not wear jewelry or makeup Do not wear nail polish, gel polish, artificial nails, or any other type of covering on natural nails (fingers and toes) Do not bring valuables to the hospital. [facility] is not responsible for valuables/personal belongings. Put on clean/comfortable clothes.  Please brush your teeth.  Ask your nurse before applying any prescription medications to the skin.

## 2023-12-30 ENCOUNTER — Other Ambulatory Visit: Payer: Self-pay

## 2023-12-30 ENCOUNTER — Other Ambulatory Visit: Payer: Self-pay | Admitting: Physician Assistant

## 2023-12-30 ENCOUNTER — Encounter (HOSPITAL_COMMUNITY)
Admission: RE | Admit: 2023-12-30 | Discharge: 2023-12-30 | Disposition: A | Discharge status: Home / Self Care | Source: Ambulatory Visit | Attending: Surgery | Admitting: Surgery

## 2023-12-30 ENCOUNTER — Encounter (HOSPITAL_COMMUNITY): Payer: Self-pay

## 2023-12-30 ENCOUNTER — Encounter: Payer: Self-pay | Admitting: Surgery

## 2023-12-30 VITALS — BP 97/82 | HR 87 | Temp 98.2°F | Resp 17

## 2023-12-30 DIAGNOSIS — Z01818 Encounter for other preprocedural examination: Secondary | ICD-10-CM | POA: Insufficient documentation

## 2023-12-30 DIAGNOSIS — I70202 Unspecified atherosclerosis of native arteries of extremities, left leg: Secondary | ICD-10-CM | POA: Insufficient documentation

## 2023-12-30 HISTORY — DX: Peripheral vascular disease, unspecified: I73.9

## 2023-12-30 HISTORY — DX: Prediabetes: R73.03

## 2023-12-30 LAB — URINALYSIS, ROUTINE W REFLEX MICROSCOPIC
Bilirubin Urine: NEGATIVE
Glucose, UA: NEGATIVE mg/dL
Ketones, ur: NEGATIVE mg/dL
Leukocytes,Ua: NEGATIVE
Nitrite: NEGATIVE
Protein, ur: NEGATIVE mg/dL
Specific Gravity, Urine: 1.016 (ref 1.005–1.030)
pH: 5 (ref 5.0–8.0)

## 2023-12-30 LAB — PROTIME-INR
INR: 0.8 (ref 0.8–1.2)
Prothrombin Time: 11.9 s (ref 11.4–15.2)

## 2023-12-30 LAB — APTT: aPTT: 34 s (ref 24–36)

## 2023-12-30 LAB — COMPREHENSIVE METABOLIC PANEL WITH GFR
ALT: 15 U/L (ref 0–44)
AST: 22 U/L (ref 15–41)
Albumin: 4 g/dL (ref 3.5–5.0)
Alkaline Phosphatase: 80 U/L (ref 38–126)
Anion gap: 12 (ref 5–15)
BUN: 22 mg/dL (ref 8–23)
CO2: 23 mmol/L (ref 22–32)
Calcium: 9.3 mg/dL (ref 8.9–10.3)
Chloride: 102 mmol/L (ref 98–111)
Creatinine, Ser: 1.02 mg/dL — ABNORMAL HIGH (ref 0.44–1.00)
GFR, Estimated: 58 mL/min — ABNORMAL LOW (ref >60)
Glucose, Bld: 110 mg/dL — ABNORMAL HIGH (ref 70–99)
Potassium: 4.5 mmol/L (ref 3.5–5.1)
Sodium: 137 mmol/L (ref 135–145)
Total Bilirubin: 0.3 mg/dL (ref 0.0–1.2)
Total Protein: 7.9 g/dL (ref 6.5–8.1)

## 2023-12-30 LAB — CBC
HCT: 42 % (ref 36.0–46.0)
Hemoglobin: 13.8 g/dL (ref 12.0–15.0)
MCH: 30.7 pg (ref 26.0–34.0)
MCHC: 32.9 g/dL (ref 30.0–36.0)
MCV: 93.3 fL (ref 80.0–100.0)
Platelets: 322 K/uL (ref 150–400)
RBC: 4.5 MIL/uL (ref 3.87–5.11)
RDW: 13.1 % (ref 11.5–15.5)
WBC: 7.3 K/uL (ref 4.0–10.5)
nRBC: 0 % (ref 0.0–0.2)

## 2023-12-30 LAB — TYPE AND SCREEN
ABO/RH(D): O POS
Antibody Screen: NEGATIVE

## 2023-12-30 LAB — SURGICAL PCR SCREEN
MRSA, PCR: NEGATIVE
Staphylococcus aureus: NEGATIVE

## 2023-12-30 MED ORDER — NITROFURANTOIN MONOHYD MACRO 100 MG PO CAPS: 100.0000 mg | ORAL_CAPSULE | Freq: Two times a day (BID) | ORAL | 0 refills | Status: AC

## 2023-12-30 MED ORDER — NITROFURANTOIN MONOHYD MACRO 100 MG PO CAPS: 100.0000 mg | ORAL_CAPSULE | Freq: Two times a day (BID) | ORAL | Status: DC

## 2023-12-30 NOTE — Progress Notes (Signed)
 Alan Glance, OR scheduler for Dr. Serene, made aware of abnormal UA results

## 2023-12-30 NOTE — Progress Notes (Signed)
 PCP - Dr. Lucie Reus Cardiologist - Dr. Gordy Bergamo - Last office visit 09/30/2023  PPM/ICD - Denies Device Orders - n/a Rep Notified - n/a  Chest x-ray - n/a EKG - 09/30/2023 Stress Test - 06/20/2015 ECHO - 09/29/2020 (CE) Cardiac Cath - Denies  Sleep Study - Denies CPAP - n/a  Pt is Pre-DM  Last dose of GLP1 agonist- n/a GLP1 instructions: n/a  Blood Thinner Instructions: n/a Aspirin  Instructions: Pt instructed to continue taking her ASA through the day before surgery (pt takes at night and instructed to take it night before)  NPO after midnight  COVID TEST- n/a   Anesthesia review: Yes. Hx of HTN with yearly cardiac visits and renal artery stent placed.  Patient denies shortness of breath, fever, cough and chest pain at PAT appointment. Pt denies any respiratory illness/infection in the last two months.   All instructions explained to the patient and patients daughter, Karina [name], with a verbal understanding of the material. Patient agrees to go over the instructions while at home for a better understanding. Patient also instructed to self quarantine after being tested for COVID-19. The opportunity to ask questions was provided.

## 2024-01-01 ENCOUNTER — Encounter (HOSPITAL_COMMUNITY): Payer: Self-pay | Admitting: Surgery

## 2024-01-01 ENCOUNTER — Other Ambulatory Visit: Payer: Self-pay

## 2024-01-01 ENCOUNTER — Inpatient Hospital Stay (HOSPITAL_COMMUNITY)
Admission: RE | Admit: 2024-01-01 | Discharge: 2024-01-02 | DRG: 272 | Disposition: A | Discharge status: Home / Self Care | Attending: Surgery | Admitting: Surgery

## 2024-01-01 ENCOUNTER — Encounter (HOSPITAL_COMMUNITY)
Admission: RE | Disposition: A | Discharge status: Home / Self Care | Payer: Self-pay | Source: Home / Self Care | Attending: Surgery

## 2024-01-01 ENCOUNTER — Inpatient Hospital Stay (HOSPITAL_COMMUNITY): Payer: Self-pay | Admitting: Physician Assistant

## 2024-01-01 ENCOUNTER — Inpatient Hospital Stay (HOSPITAL_COMMUNITY): Payer: Self-pay

## 2024-01-01 DIAGNOSIS — F419 Anxiety disorder, unspecified: Secondary | ICD-10-CM | POA: Diagnosis present

## 2024-01-01 DIAGNOSIS — E039 Hypothyroidism, unspecified: Secondary | ICD-10-CM | POA: Diagnosis present

## 2024-01-01 DIAGNOSIS — I70201 Unspecified atherosclerosis of native arteries of extremities, right leg: Secondary | ICD-10-CM | POA: Diagnosis not present

## 2024-01-01 DIAGNOSIS — Z888 Allergy status to other drugs, medicaments and biological substances status: Secondary | ICD-10-CM | POA: Diagnosis not present

## 2024-01-01 DIAGNOSIS — E785 Hyperlipidemia, unspecified: Secondary | ICD-10-CM | POA: Diagnosis present

## 2024-01-01 DIAGNOSIS — Z79899 Other long term (current) drug therapy: Secondary | ICD-10-CM

## 2024-01-01 DIAGNOSIS — Z8249 Family history of ischemic heart disease and other diseases of the circulatory system: Secondary | ICD-10-CM

## 2024-01-01 DIAGNOSIS — I70211 Atherosclerosis of native arteries of extremities with intermittent claudication, right leg: Secondary | ICD-10-CM | POA: Diagnosis present

## 2024-01-01 DIAGNOSIS — I10 Essential (primary) hypertension: Secondary | ICD-10-CM | POA: Diagnosis present

## 2024-01-01 DIAGNOSIS — H9190 Unspecified hearing loss, unspecified ear: Secondary | ICD-10-CM | POA: Diagnosis present

## 2024-01-01 DIAGNOSIS — E7800 Pure hypercholesterolemia, unspecified: Secondary | ICD-10-CM | POA: Diagnosis present

## 2024-01-01 DIAGNOSIS — Z7982 Long term (current) use of aspirin: Secondary | ICD-10-CM

## 2024-01-01 DIAGNOSIS — Z01818 Encounter for other preprocedural examination: Secondary | ICD-10-CM | POA: Diagnosis not present

## 2024-01-01 DIAGNOSIS — Z87891 Personal history of nicotine dependence: Secondary | ICD-10-CM

## 2024-01-01 DIAGNOSIS — I739 Peripheral vascular disease, unspecified: Principal | ICD-10-CM | POA: Diagnosis present

## 2024-01-01 DIAGNOSIS — Z823 Family history of stroke: Secondary | ICD-10-CM

## 2024-01-01 DIAGNOSIS — Z79890 Hormone replacement therapy (postmenopausal): Secondary | ICD-10-CM | POA: Diagnosis not present

## 2024-01-01 DIAGNOSIS — I70209 Unspecified atherosclerosis of native arteries of extremities, unspecified extremity: Secondary | ICD-10-CM | POA: Diagnosis present

## 2024-01-01 DIAGNOSIS — Z833 Family history of diabetes mellitus: Secondary | ICD-10-CM

## 2024-01-01 DIAGNOSIS — D72829 Elevated white blood cell count, unspecified: Secondary | ICD-10-CM | POA: Diagnosis present

## 2024-01-01 HISTORY — PX: ENDARTERECTOMY FEMORAL: SHX5804

## 2024-01-01 LAB — ABO/RH: ABO/RH(D): O POS

## 2024-01-01 LAB — CBC
HCT: 33.9 % — ABNORMAL LOW (ref 36.0–46.0)
Hemoglobin: 11.3 g/dL — ABNORMAL LOW (ref 12.0–15.0)
MCH: 31 pg (ref 26.0–34.0)
MCHC: 33.3 g/dL (ref 30.0–36.0)
MCV: 93.1 fL (ref 80.0–100.0)
Platelets: 259 K/uL (ref 150–400)
RBC: 3.64 MIL/uL — ABNORMAL LOW (ref 3.87–5.11)
RDW: 12.9 % (ref 11.5–15.5)
WBC: 8.8 K/uL (ref 4.0–10.5)
nRBC: 0 % (ref 0.0–0.2)

## 2024-01-01 LAB — CREATININE, SERUM
Creatinine, Ser: 0.73 mg/dL (ref 0.44–1.00)
GFR, Estimated: >60 mL/min (ref >60)

## 2024-01-01 SURGERY — ENDARTERECTOMY, FEMORAL
Anesthesia: General | Site: Leg Upper | Laterality: Right

## 2024-01-01 MED ORDER — MIDAZOLAM HCL (PF) 2 MG/2ML IJ SOLN
INTRAMUSCULAR | Status: DC | PRN
Start: 2024-01-01 — End: 2024-01-01
  Administered 2024-01-01: 2 mg via INTRAVENOUS

## 2024-01-01 MED ORDER — CHLORHEXIDINE GLUCONATE CLOTH 2 % EX PADS: 6.0000 | MEDICATED_PAD | Freq: Once | CUTANEOUS | Status: DC

## 2024-01-01 MED ORDER — DEXMEDETOMIDINE HCL IN NACL 80 MCG/20ML IV SOLN
INTRAVENOUS | Status: AC
  Filled 2024-01-01: qty 20

## 2024-01-01 MED ORDER — HEPARIN 6000 UNIT IRRIGATION SOLUTION
Status: AC
  Filled 2024-01-01: qty 500

## 2024-01-01 MED ORDER — OXYCODONE HCL 5 MG PO TABS: 5.0000 mg | ORAL_TABLET | Freq: Once | ORAL | Status: DC | PRN

## 2024-01-01 MED ORDER — LIDOCAINE 2% (20 MG/ML) 5 ML SYRINGE
INTRAMUSCULAR | Status: DC | PRN
  Administered 2024-01-01: 30 mg via INTRAVENOUS

## 2024-01-01 MED ORDER — EPHEDRINE SULFATE-NACL 50-0.9 MG/10ML-% IV SOSY
PREFILLED_SYRINGE | INTRAVENOUS | Status: DC | PRN
  Administered 2024-01-01: 5 mg via INTRAVENOUS
  Administered 2024-01-01: 10 mg via INTRAVENOUS
  Administered 2024-01-01 (×2): 5 mg via INTRAVENOUS

## 2024-01-01 MED ORDER — CEFAZOLIN SODIUM-DEXTROSE 2-4 GM/100ML-% IV SOLN
2.0000 g | Freq: Three times a day (TID) | INTRAVENOUS | Status: AC
  Administered 2024-01-01 (×2): 2 g via INTRAVENOUS
  Filled 2024-01-01 (×2): qty 100

## 2024-01-01 MED ORDER — PROTAMINE SULFATE 10 MG/ML IV SOLN
INTRAVENOUS | Status: DC | PRN
  Administered 2024-01-01: 50 mg via INTRAVENOUS

## 2024-01-01 MED ORDER — SODIUM CHLORIDE 0.9 % IV SOLN: 500.0000 mL | Freq: Once | INTRAVENOUS | Status: DC | PRN

## 2024-01-01 MED ORDER — SODIUM CHLORIDE 0.9 % IV SOLN: INTRAVENOUS | Status: DC

## 2024-01-01 MED ORDER — DEXAMETHASONE SOD PHOSPHATE PF 10 MG/ML IJ SOLN
INTRAMUSCULAR | Status: DC | PRN
  Administered 2024-01-01: 10 mg via INTRAVENOUS

## 2024-01-01 MED ORDER — ORAL CARE MOUTH RINSE: 15.0000 mL | Freq: Once | OROMUCOSAL | Status: AC

## 2024-01-01 MED ORDER — HEPARIN SODIUM (PORCINE) 5000 UNIT/ML IJ SOLN
5000.0000 [IU] | Freq: Three times a day (TID) | INTRAMUSCULAR | Status: DC
  Administered 2024-01-02: 5000 [IU] via SUBCUTANEOUS
  Filled 2024-01-01: qty 1

## 2024-01-01 MED ORDER — ATENOLOL 25 MG PO TABS: 25.0000 mg | ORAL_TABLET | ORAL | Status: DC

## 2024-01-01 MED ORDER — PROPOFOL 10 MG/ML IV BOLUS
INTRAVENOUS | Status: AC
Start: 2024-01-01 — End: 2024-01-01
  Filled 2024-01-01: qty 20

## 2024-01-01 MED ORDER — PRAVASTATIN SODIUM 40 MG PO TABS
20.0000 mg | ORAL_TABLET | Freq: Every evening | ORAL | Status: DC
Start: 2024-01-01 — End: 2024-01-02
  Administered 2024-01-01: 20 mg via ORAL
  Filled 2024-01-01: qty 1

## 2024-01-01 MED ORDER — FENTANYL CITRATE (PF) 250 MCG/5ML IJ SOLN
INTRAMUSCULAR | Status: DC | PRN
  Administered 2024-01-01: 50 ug via INTRAVENOUS

## 2024-01-01 MED ORDER — METOPROLOL TARTRATE 5 MG/5ML IV SOLN: 2.5000 mg | INTRAVENOUS | Status: DC | PRN

## 2024-01-01 MED ORDER — OXYCODONE-ACETAMINOPHEN 5-325 MG PO TABS: 1.0000 | ORAL_TABLET | ORAL | Status: DC | PRN

## 2024-01-01 MED ORDER — SUGAMMADEX SODIUM 200 MG/2ML IV SOLN
INTRAVENOUS | Status: DC | PRN
  Administered 2024-01-01: 200 mg via INTRAVENOUS

## 2024-01-01 MED ORDER — POLYETHYLENE GLYCOL 3350 17 G PO PACK: 17.0000 g | PACK | Freq: Every day | ORAL | Status: DC | PRN

## 2024-01-01 MED ORDER — ONDANSETRON HCL 4 MG/2ML IJ SOLN: 4.0000 mg | Freq: Four times a day (QID) | INTRAMUSCULAR | Status: DC | PRN

## 2024-01-01 MED ORDER — FENTANYL CITRATE (PF) 100 MCG/2ML IJ SOLN
INTRAMUSCULAR | Status: AC
  Filled 2024-01-01: qty 2

## 2024-01-01 MED ORDER — DROPERIDOL 2.5 MG/ML IJ SOLN: 0.6250 mg | Freq: Once | INTRAMUSCULAR | Status: DC | PRN

## 2024-01-01 MED ORDER — ACETAMINOPHEN 10 MG/ML IV SOLN: 1000.0000 mg | Freq: Once | INTRAVENOUS | Status: DC | PRN

## 2024-01-01 MED ORDER — HEPARIN SODIUM (PORCINE) 1000 UNIT/ML IJ SOLN
INTRAMUSCULAR | Status: AC
Start: 2024-01-01 — End: 2024-01-01
  Filled 2024-01-01: qty 10

## 2024-01-01 MED ORDER — ACETAMINOPHEN 325 MG PO TABS: 325.0000 mg | ORAL_TABLET | ORAL | Status: DC | PRN

## 2024-01-01 MED ORDER — PHENYLEPHRINE 80 MCG/ML (10ML) SYRINGE FOR IV PUSH (FOR BLOOD PRESSURE SUPPORT)
PREFILLED_SYRINGE | INTRAVENOUS | Status: DC | PRN
  Administered 2024-01-01 (×2): 160 ug via INTRAVENOUS

## 2024-01-01 MED ORDER — ASPIRIN 81 MG PO CHEW
81.0000 mg | CHEWABLE_TABLET | Freq: Every day | ORAL | Status: DC
  Administered 2024-01-02: 81 mg via ORAL
  Filled 2024-01-01: qty 1

## 2024-01-01 MED ORDER — PROPOFOL 10 MG/ML IV BOLUS
INTRAVENOUS | Status: DC | PRN
  Administered 2024-01-01: 100 mg via INTRAVENOUS

## 2024-01-01 MED ORDER — SURGIFLO WITH THROMBIN (HEMOSTATIC MATRIX KIT) OPTIME
TOPICAL | Status: DC | PRN
  Administered 2024-01-01: 1 via TOPICAL

## 2024-01-01 MED ORDER — IRBESARTAN 150 MG PO TABS: 75.0000 mg | ORAL_TABLET | Freq: Every day | ORAL | Status: DC

## 2024-01-01 MED ORDER — ONDANSETRON HCL 4 MG/2ML IJ SOLN
INTRAMUSCULAR | Status: AC
Start: 2024-01-01 — End: 2024-01-01
  Filled 2024-01-01: qty 2

## 2024-01-01 MED ORDER — ROCURONIUM BROMIDE 10 MG/ML (PF) SYRINGE
PREFILLED_SYRINGE | INTRAVENOUS | Status: DC | PRN
  Administered 2024-01-01: 20 mg via INTRAVENOUS
  Administered 2024-01-01: 50 mg via INTRAVENOUS

## 2024-01-01 MED ORDER — LABETALOL HCL 5 MG/ML IV SOLN: 10.0000 mg | INTRAVENOUS | Status: DC | PRN

## 2024-01-01 MED ORDER — HEPARIN 6000 UNIT IRRIGATION SOLUTION
Status: DC | PRN
  Administered 2024-01-01: 1

## 2024-01-01 MED ORDER — HEPARIN SODIUM (PORCINE) 1000 UNIT/ML IJ SOLN
INTRAMUSCULAR | Status: DC | PRN
Start: 2024-01-01 — End: 2024-01-01
  Administered 2024-01-01: 6000 [IU] via INTRAVENOUS

## 2024-01-01 MED ORDER — ONDANSETRON HCL 4 MG/2ML IJ SOLN
INTRAMUSCULAR | Status: DC | PRN
  Administered 2024-01-01: 4 mg via INTRAVENOUS

## 2024-01-01 MED ORDER — OXYCODONE HCL 5 MG/5ML PO SOLN: 5.0000 mg | Freq: Once | ORAL | Status: DC | PRN

## 2024-01-01 MED ORDER — DOCUSATE SODIUM 100 MG PO CAPS
100.0000 mg | ORAL_CAPSULE | Freq: Every day | ORAL | Status: DC
  Administered 2024-01-02: 100 mg via ORAL
  Filled 2024-01-01: qty 1

## 2024-01-01 MED ORDER — MIDAZOLAM HCL 2 MG/2ML IJ SOLN
INTRAMUSCULAR | Status: AC
  Filled 2024-01-01: qty 2

## 2024-01-01 MED ORDER — POTASSIUM CHLORIDE CRYS ER 20 MEQ PO TBCR: 40.0000 meq | EXTENDED_RELEASE_TABLET | Freq: Every day | ORAL | Status: DC | PRN

## 2024-01-01 MED ORDER — PHENOL 1.4 % MT LIQD: 1.0000 | OROMUCOSAL | Status: DC | PRN

## 2024-01-01 MED ORDER — 0.9 % SODIUM CHLORIDE (POUR BTL) OPTIME
TOPICAL | Status: DC | PRN
Start: 2024-01-01 — End: 2024-01-01
  Administered 2024-01-01: 2000 mL

## 2024-01-01 MED ORDER — FENTANYL CITRATE (PF) 100 MCG/2ML IJ SOLN: 25.0000 ug | INTRAMUSCULAR | Status: DC | PRN

## 2024-01-01 MED ORDER — AMLODIPINE BESYLATE 10 MG PO TABS
10.0000 mg | ORAL_TABLET | Freq: Every day | ORAL | Status: DC
  Administered 2024-01-02: 10 mg via ORAL
  Filled 2024-01-01 (×2): qty 1

## 2024-01-01 MED ORDER — CHLORHEXIDINE GLUCONATE 0.12 % MT SOLN
15.0000 mL | Freq: Once | OROMUCOSAL | Status: AC
  Administered 2024-01-01: 15 mL via OROMUCOSAL
  Filled 2024-01-01: qty 15

## 2024-01-01 MED ORDER — NITROFURANTOIN MONOHYD MACRO 100 MG PO CAPS
100.0000 mg | ORAL_CAPSULE | Freq: Two times a day (BID) | ORAL | Status: DC
  Administered 2024-01-01 – 2024-01-02 (×2): 100 mg via ORAL
  Filled 2024-01-01 (×2): qty 1

## 2024-01-01 MED ORDER — ACETAMINOPHEN 650 MG RE SUPP: 325.0000 mg | RECTAL | Status: DC | PRN

## 2024-01-01 MED ORDER — PHENYLEPHRINE HCL-NACL 20-0.9 MG/250ML-% IV SOLN
INTRAVENOUS | Status: DC | PRN
  Administered 2024-01-01: 40 ug/min via INTRAVENOUS

## 2024-01-01 MED ORDER — PROTAMINE SULFATE 10 MG/ML IV SOLN
INTRAVENOUS | Status: AC
  Filled 2024-01-01: qty 25

## 2024-01-01 MED ORDER — LACTATED RINGERS IV SOLN: INTRAVENOUS | Status: DC | PRN

## 2024-01-01 MED ORDER — LEVOTHYROXINE SODIUM 100 MCG PO TABS
100.0000 ug | ORAL_TABLET | Freq: Every day | ORAL | Status: DC
  Administered 2024-01-02: 100 ug via ORAL
  Filled 2024-01-01: qty 1

## 2024-01-01 MED ORDER — CEFAZOLIN SODIUM-DEXTROSE 2-4 GM/100ML-% IV SOLN
2.0000 g | INTRAVENOUS | Status: AC
  Administered 2024-01-01: 2 g via INTRAVENOUS
  Filled 2024-01-01: qty 100

## 2024-01-01 MED ORDER — ROCURONIUM BROMIDE 10 MG/ML (PF) SYRINGE
PREFILLED_SYRINGE | INTRAVENOUS | Status: AC
Start: 2024-01-01 — End: 2024-01-01
  Filled 2024-01-01: qty 10

## 2024-01-01 MED ORDER — BISACODYL 5 MG PO TBEC: 5.0000 mg | DELAYED_RELEASE_TABLET | Freq: Every day | ORAL | Status: DC | PRN

## 2024-01-01 MED ORDER — LIDOCAINE 2% (20 MG/ML) 5 ML SYRINGE
INTRAMUSCULAR | Status: AC
  Filled 2024-01-01: qty 5

## 2024-01-01 MED ORDER — HYDROMORPHONE HCL 1 MG/ML IJ SOLN: 0.5000 mg | INTRAMUSCULAR | Status: DC | PRN

## 2024-01-01 SURGICAL SUPPLY — 31 items
BAG COUNTER SPONGE SURGICOUNT (BAG) ×1 IMPLANT
CANISTER SUCTION 3000ML PPV (SUCTIONS) ×1 IMPLANT
CATH EMB 4FR 40 (CATHETERS) IMPLANT
CLIP TI MEDIUM 24 (CLIP) ×1 IMPLANT
CLIP TI WIDE RED SMALL 24 (CLIP) ×1 IMPLANT
COVER PROBE W GEL 5X96 (DRAPES) IMPLANT
DERMABOND ADVANCED .7 DNX12 (GAUZE/BANDAGES/DRESSINGS) ×1 IMPLANT
ELECTRODE REM PT RTRN 9FT ADLT (ELECTROSURGICAL) ×1 IMPLANT
GLOVE BIOGEL PI IND STRL 8 (GLOVE) ×1 IMPLANT
GLOVE SURG SS PI 7.5 STRL IVOR (GLOVE) ×1 IMPLANT
GOWN STRL REUS W/ TWL LRG LVL3 (GOWN DISPOSABLE) ×2 IMPLANT
GOWN STRL REUS W/ TWL XL LVL3 (GOWN DISPOSABLE) ×1 IMPLANT
GRAFT VASC PATCH XENOSURE 1X14 (Vascular Products) IMPLANT
HEMOSTAT SNOW SURGICEL 2X4 (HEMOSTASIS) IMPLANT
KIT BASIN OR (CUSTOM PROCEDURE TRAY) ×1 IMPLANT
KIT TURNOVER KIT B (KITS) ×1 IMPLANT
PACK PERIPHERAL VASCULAR (CUSTOM PROCEDURE TRAY) ×1 IMPLANT
PAD ARMBOARD POSITIONER FOAM (MISCELLANEOUS) ×2 IMPLANT
SOLN 0.9% NACL POUR BTL 1000ML (IV SOLUTION) ×2 IMPLANT
SOLN STERILE WATER BTL 1000 ML (IV SOLUTION) ×1 IMPLANT
STOPCOCK 4WAY DISCOFIX (IV SETS) IMPLANT
SURGIFLO W/THROMBIN 8M KIT (HEMOSTASIS) IMPLANT
SUT PROLENE 5 0 C 1 24 (SUTURE) ×1 IMPLANT
SUT PROLENE 5 0 C 1 36 (SUTURE) IMPLANT
SUT PROLENE 6 0 BV (SUTURE) ×1 IMPLANT
SUT VIC AB 2-0 CT1 TAPERPNT 27 (SUTURE) ×1 IMPLANT
SUT VIC AB 3-0 SH 27X BRD (SUTURE) ×1 IMPLANT
SUT VIC AB 4-0 PS2 18 (SUTURE) IMPLANT
SYR 3ML LL SCALE MARK (SYRINGE) IMPLANT
TOWEL GREEN STERILE (TOWEL DISPOSABLE) ×1 IMPLANT
UNDERPAD 30X36 HEAVY ABSORB (UNDERPADS AND DIAPERS) ×1 IMPLANT

## 2024-01-01 NOTE — Transfer of Care (Signed)
 Immediate Anesthesia Transfer of Care Note  Patient: Karina [name]  Procedure(s) Performed: ENDARTERECTOMY, RIGHT FEMORAL USING 1CM X 14CM XENOSURE BIOLOGIC PATCH (Right: Leg Upper)  Patient Location: PACU  Anesthesia Type:General  Level of Consciousness: alert   Airway & Oxygen Therapy: Patient Spontanous Breathing and Patient connected to face mask oxygen  Post-op Assessment: Report given to RN  Post vital signs: Reviewed and stable  Last Vitals:  Vitals Value Taken Time  BP 123/50 01/01/24 10:17  Temp 37 C 01/01/24 10:17  Pulse 74 01/01/24 10:25  Resp 15 01/01/24 10:25  SpO2 94 % 01/01/24 10:25  Vitals shown include unfiled device data.  Last Pain:  Vitals:   01/01/24 0615  TempSrc:   PainSc: 0-No pain         Complications: No notable events documented.

## 2024-01-01 NOTE — Op Note (Addendum)
    Patient name: Karina [name] MRN: [id number] DOB: [birth year] Sex: female  01/01/2024 Pre-operative Diagnosis: Right leg claudication Post-operative diagnosis:  Same Surgeon:  [name] Assistants:  Adina Sender, PA Procedure:   Right iliofemoral endarterectomy with bovine pericardial patch angioplasty Anesthesia:  general Blood Loss:  100 cc Specimens:  none  Findings: Near occlusive plaque in the distal common femoral artery.  Eversion endarterectomy of the profunda was performed.  The plaque extended up under the inguinal ligament into the distal external iliac artery and this was removed under direct vision.  Indications: This is a 73 year old female with severe right leg claudication.  On angiography she was found to have a high-grade common femoral stenosis and a SFA occlusion.  We discussed proceeding with femoral endarterectomy which should help her claudication and borderline rest pain.  If she continues to have difficulty, she would be a candidate for percutaneous intervention  Procedure:  The patient was identified in the holding area and taken to [facility] OR ROOM 16  The patient was then placed supine on the table. general anesthesia was administered.  The patient was prepped and draped in the usual sterile fashion.  A time out was called and antibiotics were administered.  An experienced assistant was necessary to expedite the procedure.  He helped with exposure by providing suction and retraction.  He helped with the anastomosis by following the suture.  He helped with wound closure.  An oblique incision was made above the groin crease in the right.  Cautery was used to divide subcutaneous tissue.  I then sharply dissected out the common femoral artery from the inguinal ligament down to the bifurcation.  The profunda and superficial femoral artery were individually isolated as were side branches.  The crossing iliac vein was ligated under the inguinal ligament as well as the  profundofemoral vein between the SFA and profunda.  Once I had adequate exposure the patient was fully heparinized.  The vessels were then occluded.  A #11 blade was used to make an arteriotomy which extended longitudinally with Potts scissors.  The arteriotomy went on to the origin of the superficial femoral artery.  A Zulema Millin elevator was used to perform endarterectomy of the common femoral artery.  Eversion endarterectomy of the profundofemoral was performed.  I then inserted a Fogarty balloon into the distal external iliac artery and remove the plaque in the distal external iliac artery under direct vision.  The patient had excellent inflow.  I made sure there was no potential embolic debris.  A bovine pericardial patch was selected and patch angioplasty was performed with running 5-0 Prolene.  Prior to completion, the appropriate flushing maneuvers were performed and the anastomosis was completed.  The patient had excellent Doppler signals in the profunda and common femoral artery.  The patient's heparin  was reversed with 50 mg of protamine.  Once I was satisfied with hemostasis, the wound was irrigated.  The femoral sheath was reapproximated with 2-0 Vicryl.  Subcutaneous tissue was closed with 3-0 Vicryl and the skin was closed with 4-0 Vicryl followed by Dermabond.  The patient was successfully extubated taken recovery stable condition.  There are no immediate complications.   Disposition: To PACU stable   V. [name], M.D., [facility] [place] Office: [phone] Pager:  [phone]

## 2024-01-01 NOTE — Anesthesia Postprocedure Evaluation (Signed)
 Anesthesia Post Note  Patient: Karina [name]  Procedure(s) Performed: ENDARTERECTOMY, RIGHT FEMORAL USING 1CM X 14CM XENOSURE BIOLOGIC PATCH (Right: Leg Upper)     Patient location during evaluation: PACU Anesthesia Type: General Level of consciousness: awake and alert Pain management: pain level controlled Vital Signs Assessment: post-procedure vital signs reviewed and stable Respiratory status: spontaneous breathing, nonlabored ventilation, respiratory function stable and patient connected to nasal cannula oxygen Cardiovascular status: blood pressure returned to baseline and stable Postop Assessment: no apparent nausea or vomiting Anesthetic complications: no   No notable events documented.  Last Vitals:  Vitals:   01/01/24 1328 01/01/24 1408  BP: (!) 105/47 117/61  Pulse: (!) 57 63  Resp: 13 12  Temp:    SpO2: 99% 98%    Last Pain:  Vitals:   01/01/24 1232  TempSrc: Oral  PainSc: 0-No pain                 Franky JONETTA Bald

## 2024-01-01 NOTE — Progress Notes (Signed)
 Per pt's daughter pt was prescribed abx after U/A and she was taking them. Pt is not sure what abx it was.

## 2024-01-01 NOTE — Anesthesia Procedure Notes (Addendum)
 Arterial Line Insertion Start/End10/29/2025 6:55 AM, 01/01/2024 7:00 AM Performed by: Tilford Franky BIRCH, MD, Estelle Merck, Ethel, RN, CRNA  Patient location: Pre-op. Preanesthetic checklist: patient identified, IV checked, site marked, risks and benefits discussed, surgical consent, monitors and equipment checked, pre-op evaluation, timeout performed and anesthesia consent Lidocaine  1% used for infiltration Right, radial was placed Catheter size: 20 G Hand hygiene performed   Attempts: 1 Procedure performed using ultrasound to evaluate access site. Ultrasound Notes:relevant anatomy identified, ultrasound used to visualize needle entry and vessel patent under ultrasound. Following insertion, dressing applied and Biopatch. Post procedure assessment: normal

## 2024-01-01 NOTE — Anesthesia Preprocedure Evaluation (Addendum)
 Anesthesia Evaluation  Patient identified by MRN, date of birth, ID band Patient awake    Reviewed: Allergy & Precautions, NPO status , Patient's Chart, lab work & pertinent test results  Airway Mallampati: III  TM Distance: >3 FB Neck ROM: Full    Dental  (+) Poor Dentition, Missing, Loose, Implants, Dental Advisory Given   Pulmonary former smoker   breath sounds clear to auscultation       Cardiovascular hypertension, Pt. on medications and Pt. on home beta blockers + Peripheral Vascular Disease   Rhythm:Regular Rate:Normal     Neuro/Psych   Anxiety     negative neurological ROS     GI/Hepatic negative GI ROS, Neg liver ROS,,,  Endo/Other  Hypothyroidism    Renal/GU negative Renal ROS     Musculoskeletal negative musculoskeletal ROS (+)    Abdominal   Peds  Hematology negative hematology ROS (+)   Anesthesia Other Findings   Reproductive/Obstetrics                              Anesthesia Physical Anesthesia Plan  ASA: 3  Anesthesia Plan: General   Post-op Pain Management: Tylenol  PO (pre-op)*   Induction: Intravenous  PONV Risk Score and Plan: 4 or greater and Ondansetron , Dexamethasone and Treatment may vary due to age or medical condition  Airway Management Planned: Oral ETT  Additional Equipment: Arterial line  Intra-op Plan:   Post-operative Plan: Extubation in OR  Informed Consent: I have reviewed the patients History and Physical, chart, labs and discussed the procedure including the risks, benefits and alternatives for the proposed anesthesia with the patient or authorized representative who has indicated his/her understanding and acceptance.     Dental advisory given  Plan Discussed with: CRNA  Anesthesia Plan Comments:          Anesthesia Quick Evaluation

## 2024-01-01 NOTE — Interval H&P Note (Signed)
 History and Physical Interval Note:  01/01/2024 7:31 AM  Karina [name]  has presented today for surgery, with the diagnosis of right femoral occlusion.  The various methods of treatment have been discussed with the patient and family. After consideration of risks, benefits and other options for treatment, the patient has consented to  Procedure(s): ENDARTERECTOMY, FEMORAL (Right) as a surgical intervention.  The patient's history has been reviewed, patient examined, no change in status, stable for surgery.  I have reviewed the patient's chart and labs.  Questions were answered to the patient's satisfaction.     [name]

## 2024-01-01 NOTE — Progress Notes (Signed)
 Patient arrived in the unit, V/S obtained, CCMD notified, CHG bath given, right femoral site is clean and dry, all needs met, call bell in reach, pt's daughter at bedside.     01/01/24 1232  Vitals  Temp 98.2 F (36.8 C)  Temp Source Oral  BP (!) 117/49  MAP (mmHg) 68  BP Location Left Arm  BP Method Automatic  Patient Position (if appropriate) Lying  Pulse Rate 66  Pulse Rate Source Monitor  ECG Heart Rate 68  Resp 17  Level of Consciousness  Level of Consciousness Alert  MEWS COLOR  MEWS Score Color Green  Oxygen Therapy  SpO2 94 %  O2 Device Nasal Cannula  O2 Flow Rate (L/min) 2 L/min  Pain Assessment  Pain Scale 0-10  Pain Score 0  MEWS Score  MEWS Temp 0  MEWS Systolic 0  MEWS Pulse 0  MEWS RR 0  MEWS LOC 0  MEWS Score 0

## 2024-01-01 NOTE — Anesthesia Procedure Notes (Signed)
 Procedure Name: Intubation Date/Time: 01/01/2024 7:54 AM  Performed by: Lockie Flesher, CRNAPre-anesthesia Checklist: Patient identified, Emergency Drugs available, Suction available and Patient being monitored Patient Re-evaluated:Patient Re-evaluated prior to induction Oxygen Delivery Method: Circle system utilized Preoxygenation: Pre-oxygenation with 100% oxygen Induction Type: IV induction Ventilation: Mask ventilation without difficulty Laryngoscope Size: Mac and 3 Grade View: Grade I Tube type: Oral Tube size: 7.0 mm Number of attempts: 1 Airway Equipment and Method: Stylet and Oral airway Placement Confirmation: ETT inserted through vocal cords under direct vision, positive ETCO2 and breath sounds checked- equal and bilateral Secured at: 22 cm Tube secured with: Tape Dental Injury: Teeth and Oropharynx as per pre-operative assessment

## 2024-01-02 ENCOUNTER — Encounter (HOSPITAL_COMMUNITY): Payer: Self-pay | Admitting: Surgery

## 2024-01-02 LAB — LIPID PANEL
Cholesterol: 121 mg/dL (ref 0–200)
HDL: 52 mg/dL (ref >40)
LDL Cholesterol: 59 mg/dL (ref 0–99)
Total CHOL/HDL Ratio: 2.3 ratio
Triglycerides: 50 mg/dL (ref <150)
VLDL: 10 mg/dL (ref 0–40)

## 2024-01-02 LAB — BASIC METABOLIC PANEL WITH GFR
Anion gap: 12 (ref 5–15)
BUN: 18 mg/dL (ref 8–23)
CO2: 21 mmol/L — ABNORMAL LOW (ref 22–32)
Calcium: 8.1 mg/dL — ABNORMAL LOW (ref 8.9–10.3)
Chloride: 104 mmol/L (ref 98–111)
Creatinine, Ser: 0.85 mg/dL (ref 0.44–1.00)
GFR, Estimated: >60 mL/min (ref >60)
Glucose, Bld: 111 mg/dL — ABNORMAL HIGH (ref 70–99)
Potassium: 3.9 mmol/L (ref 3.5–5.1)
Sodium: 137 mmol/L (ref 135–145)

## 2024-01-02 LAB — CBC
HCT: 27.7 % — ABNORMAL LOW (ref 36.0–46.0)
Hemoglobin: 9.5 g/dL — ABNORMAL LOW (ref 12.0–15.0)
MCH: 31.5 pg (ref 26.0–34.0)
MCHC: 34.3 g/dL (ref 30.0–36.0)
MCV: 91.7 fL (ref 80.0–100.0)
Platelets: 243 K/uL (ref 150–400)
RBC: 3.02 MIL/uL — ABNORMAL LOW (ref 3.87–5.11)
RDW: 13.1 % (ref 11.5–15.5)
WBC: 12.2 K/uL — ABNORMAL HIGH (ref 4.0–10.5)
nRBC: 0 % (ref 0.0–0.2)

## 2024-01-02 LAB — POCT ACTIVATED CLOTTING TIME
Activated Clotting Time: 285 s
Activated Clotting Time: 256 s

## 2024-01-02 MED ORDER — OXYCODONE-ACETAMINOPHEN 5-325 MG PO TABS: 1.0000 | ORAL_TABLET | Freq: Four times a day (QID) | ORAL | 0 refills | Status: AC | PRN

## 2024-01-02 NOTE — Discharge Instructions (Signed)
 [facility]  Discharge instructions  Lower Extremity Bypass Surgery  Please refer to the following instruction for your post-procedure care. Your surgeon or physician assistant will discuss any changes with you.  Activity  You are encouraged to walk as much as you can. You can slowly return to normal activities during the month after your surgery. Avoid strenuous activity and heavy lifting until your doctor tells you it's OK. Avoid activities such as vacuuming or swinging a golf club. Do not drive until your doctor give the OK and you are no longer taking prescription pain medications. It is also normal to have difficulty with sleep habits, eating and bowel movement after surgery. These will go away with time.  Bathing/Showering  You may shower after you go home. Do not soak in a bathtub, hot tub, or swim until the incision heals completely.  Incision Care  Clean your incision with mild soap and water. Shower every day. Pat the area dry with a clean towel. You do not need a bandage unless otherwise instructed. Do not apply any ointments or creams to your incision. If you have open wounds you will be instructed how to care for them or a visiting nurse may be arranged for you. If you have staples or sutures along your incision they will be removed at your post-op appointment. You may have skin glue on your incision. Do not peel it off. It will come off on its own in about one week. If you have a great deal of moisture in your groin, use a gauze help keep this area dry.  Diet  Resume your normal diet. There are no special food restrictions following this procedure. A low fat/ low cholesterol diet is recommended for all patients with vascular disease. In order to heal from your surgery, it is CRITICAL to get adequate nutrition. Your body requires vitamins, minerals, and protein. Vegetables are the best source of vitamins and minerals. Vegetables also provide the  perfect balance of protein. Processed food has little nutritional value, so try to avoid this.  Medications  Resume taking all your medications unless your doctor or nurse practitioner tells you not to. If your incision is causing pain, you may take over-the-counter pain relievers such as acetaminophen (Tylenol). If you were prescribed a stronger pain medication, please aware these medication can cause nausea and constipation. Prevent nausea by taking the medication with a snack or meal. Avoid constipation by drinking plenty of fluids and eating foods with high amount of fiber, such as fruits, vegetables, and grains. Take Colase 100 mg (an over-the-counter stool softener) twice a day as needed for constipation. Do not take Tylenol if you are taking prescription pain medications.  Follow Up  Our office will schedule a follow up appointment 2-3 weeks following discharge.  Please call us immediately for any of the following conditions  Severe or worsening pain in your legs or feet while at rest or while walking Increase pain, redness, warmth, or drainage (pus) from your incision site(s) Fever of 101 degree or higher The swelling in your leg with the bypass suddenly worsens and becomes more painful than when you were in the hospital If you have been instructed to feel your graft pulse then you should do so every day. If you can no longer feel this pulse, call the office immediately. Not all patients are given this instruction.  Leg swelling is common after leg bypass surgery.  The swelling should improve over a few months  following surgery. To improve the swelling, you may elevate your legs above the level of your heart while you are sitting or resting. Your surgeon or physician assistant may ask you to apply an ACE wrap or wear compression (TED) stockings to help to reduce swelling.  Reduce your risk of vascular disease  Stop smoking. If you would like help call [facility] at 1-800-QUIT-NOW  ((470) 654-9347) or [place] at (807)887-0074.  Manage your cholesterol Maintain a desired weight Control your diabetes weight Control your diabetes Keep your blood pressure down  If you have any questions, please call the office at [phone]

## 2024-01-02 NOTE — TOC Transition Note (Signed)
 Transition of Care (TOC) - Discharge Note Rayfield Gobble RN, BSN Inpatient Care Management Unit 4E- RN Case Manager See Treatment Team for direct phone #   Patient Details  Name: Karina [name] MRN: [id number] Date of Birth: 01-Nov-1950  Transition of [facility]) CM/SW Contact:  Gobble Rayfield Hurst, RN Phone Number: 01/02/2024, 11:36 AM   Clinical Narrative:    Pt stable for transition home today, order placed for DME- RW- per bedside RN pt has declined- does not need RW- ambulating independent this am.   [facility] liaison had referral from [facility] office for any HH needs- note no follow up recommendations- liaison updated no HH needs on discharge.   Family to transport home.    Final next level of care: Home/Self Care Barriers to Discharge: No Barriers Identified   Patient Goals and CMS Choice Patient states their goals for this hospitalization and ongoing recovery are:: return home   Choice offered to / list presented to : NA      Discharge Placement               Home        Discharge Plan and Services Additional resources added to the After Visit Summary for     Discharge Planning Services: CM Consult Post Acute Care Choice: Durable Medical Equipment          DME Arranged: Walker rolling, Patient refused services DME Agency: NA       HH Arranged: NA HH Agency: [facility]        Social Drivers of Health (SDOH) Interventions SDOH Screenings   Food Insecurity: No Food Insecurity (01/02/2024)  Housing: Low Risk  (01/02/2024)  Transportation Needs: No Transportation Needs (01/02/2024)  Utilities: Not At Risk (01/02/2024)  Social Connections: Unknown (01/02/2024)  Tobacco Use: Medium Risk (01/01/2024)     Readmission Risk Interventions    01/02/2024   11:36 AM  Readmission Risk Prevention Plan  Post Dischage Appt Complete  Medication Screening Complete  Transportation Screening Complete

## 2024-01-02 NOTE — Progress Notes (Signed)
  Progress Note    01/02/2024 7:15 AM 1 Day Post-Op  Subjective:  Ms. Latino is a 73 year old female who is sitting up in the chair with no complaints. Denies overnight events.    Vitals:   01/01/24 2300 01/02/24 0413  BP: (!) 108/43 (!) 121/52  Pulse: 73 71  Resp: 18 17  Temp: 98.1 F (36.7 C) 98.2 F (36.8 C)  SpO2: 94% 95%   Physical Exam: Cardiac:  S1S2, RRR, Sinus Rhythm @ 72 on telemetry.  Lungs:  CTA, non-labored.  Incisions: Clean, dry, and intact.  Extremities:  Moves all extremities well. Doppler was brisk bilaterally, DP and PT. Feet are warm.  Abdomen:  Soft, non-tender.  Neurologic: Grossly intact CN II-XII.   CBC    Component Value Date/Time   WBC 12.2 (H) 01/02/2024 0450   RBC 3.02 (L) 01/02/2024 0450   HGB 9.5 (L) 01/02/2024 0450   HGB 12.8 10/03/2023 1003   HCT 27.7 (L) 01/02/2024 0450   HCT 40.6 10/03/2023 1003   PLT 243 01/02/2024 0450   PLT 323 10/03/2023 1003   MCV 91.7 01/02/2024 0450   MCV 97 10/03/2023 1003   MCH 31.5 01/02/2024 0450   MCHC 34.3 01/02/2024 0450   RDW 13.1 01/02/2024 0450   RDW 12.8 10/03/2023 1003   LYMPHSABS 1.5 06/22/2015 1929   MONOABS 0.5 06/22/2015 1929   EOSABS 0.0 06/22/2015 1929   BASOSABS 0.0 06/22/2015 1929    BMET    Component Value Date/Time   NA 137 01/02/2024 0450   NA 138 10/03/2023 1003   K 3.9 01/02/2024 0450   CL 104 01/02/2024 0450   CO2 21 (L) 01/02/2024 0450   GLUCOSE 111 (H) 01/02/2024 0450   BUN 18 01/02/2024 0450   BUN 19 10/03/2023 1003   CREATININE 0.85 01/02/2024 0450   CREATININE 0.70 06/27/2015 1023   CALCIUM  8.1 (L) 01/02/2024 0450   GFRNONAA >60 01/02/2024 0450   GFRAA >60 06/24/2015 0737    INR    Component Value Date/Time   INR 0.8 12/30/2023 0935     Intake/Output Summary (Last 24 hours) at 01/02/2024 0715 Last data filed at 01/02/2024 0600 Gross per 24 hour  Intake 1985 ml  Output 375 ml  Net 1610 ml     Assessment/Plan:  73 y.o. female is s/p right  iliofemoral endarterectomy with bovine pericardial patch angioplasty 1 Day Post-Op   Ms. Kingsbury is a well-appearing 73 year old female with no complaints. She is sitting up in her chair this morning with her daughter present. She was ambulatory this morning with a walker assist device. She had no overnight complaints and denies any immediate needs at this time. Appropriate for discharge today.   - R. Leg - brisk doppler in PT and DP. Foot is appropriately warm. Denies pain. Was ambulatory with staff this morning in the halls.  - WBC 12.2, stress leukocytosis. She is afebrile and incision site healing appropriately.  - Hemoglobin - 9.5, blood loss during surgery. Likely dilution from intraoperative fluids, of LR. - Pain management - stable at this time. Will d/c with appropriate regimen.  - DME - Rolling walker ordered.   Lytle Pizza, PA-[facility] [phone] 01/02/2024 7:15 AM

## 2024-01-02 NOTE — Evaluation (Signed)
 Occupational Therapy Evaluation & Discharge Patient Details Name: Karina [name] MRN: [id number] DOB: 1950-05-27 Today's Date: 01/02/2024   History of Present Illness   Pt is a 73 y.o female admitted 10/29 for scheduled R fem endarterectomy. PMH: HLD, HTN, hypothyroidism     Clinical Impressions Pt admitted based on above, and was seen based on problem list below. PTA pt was independent with ADLs and IADLs. Today pt is mod I for ADLs. Completed LB dressing, toilet transfer, and standing grooming tasks at mod I, with increased time. Educated pt and daughter on safety with showers upon d/c, and encouraged use of non-slip shower mat for safety. Pt and daughter verbalized understanding of all education provided. No follow up therapy or DME needs. OT is signing off on this pt.    If plan is discharge home, recommend the following:   Assistance with cooking/housework     Functional Status Assessment   Patient has had a recent decline in their functional status and demonstrates the ability to make significant improvements in function in a reasonable and predictable amount of time.     Equipment Recommendations   None recommended by OT      Precautions/Restrictions   Precautions Precautions: Fall Recall of Precautions/Restrictions: Intact Restrictions Weight Bearing Restrictions Per Provider Order: No     Mobility Bed Mobility   General bed mobility comments: Received standing with PT    Transfers Overall transfer level: Needs assistance Equipment used: None Transfers: Sit to/from Stand Sit to Stand: Supervision           General transfer comment: S for safety with mobillity      Balance Overall balance assessment: Mild deficits observed, not formally tested         ADL either performed or assessed with clinical judgement   ADL Overall ADL's : Modified independent       General ADL Comments: Increased time and effort, but able to complete LB  dressing, toileting     Vision Baseline Vision/History: 0 No visual deficits Vision Assessment?: No apparent visual deficits            Pertinent Vitals/Pain Pain Assessment Pain Assessment: Faces Faces Pain Scale: Hurts a little bit Pain Location: Surgical site Pain Descriptors / Indicators: Discomfort Pain Intervention(s): Monitored during session     Extremity/Trunk Assessment Upper Extremity Assessment Upper Extremity Assessment: Overall WFL for tasks assessed   Lower Extremity Assessment Lower Extremity Assessment: Defer to PT evaluation   Cervical / Trunk Assessment Cervical / Trunk Assessment: Normal   Communication Communication Communication: Impaired Factors Affecting Communication: Hearing impaired   Cognition Arousal: Alert Behavior During Therapy: WFL for tasks assessed/performed Cognition: No apparent impairments     Following commands: Intact       Cueing  General Comments   Cueing Techniques: Verbal cues  VSS on RA           Home Living Family/patient expects to be discharged to:: Private residence Living Arrangements: Spouse/significant other Available Help at Discharge: Family;Available PRN/intermittently Type of Home: House Home Access: Stairs to enter     Home Layout: One level     Bathroom Shower/Tub: Chief Strategy Officer: Standard     Home Equipment: None          Prior Functioning/Environment Prior Level of Function : Independent/Modified Independent             Mobility Comments: No AD ADLs Comments: Ind    OT Problem List: Impaired balance (sitting  and/or standing);Cardiopulmonary status limiting activity   OT Treatment/Interventions: Self-care/ADL training;Therapeutic exercise;Energy conservation;DME and/or AE instruction;Therapeutic activities;Patient/family education;Balance training      OT Goals(Current goals can be found in the care plan section)   Acute Rehab OT Goals Patient  Stated Goal: To go home OT Goal Formulation: With patient Time For Goal Achievement: 01/16/24 Potential to Achieve Goals: Good   OT Frequency:  Min 2X/week       AM-PAC OT 6 Clicks Daily Activity     Outcome Measure Help from another person eating meals?: None Help from another person taking care of personal grooming?: None Help from another person toileting, which includes using toliet, bedpan, or urinal?: None Help from another person bathing (including washing, rinsing, drying)?: None Help from another person to put on and taking off regular upper body clothing?: None Help from another person to put on and taking off regular lower body clothing?: None 6 Click Score: 24   End of Session Nurse Communication: Mobility status  Activity Tolerance: Patient tolerated treatment well Patient left: in chair;with call bell/phone within reach;with family/visitor present  OT Visit Diagnosis: Unsteadiness on feet (R26.81);Other abnormalities of gait and mobility (R26.89)                Time: 9245-9185 OT Time Calculation (min): 20 min Charges:  OT General Charges $OT Visit: 1 Visit OT Evaluation $OT Eval Moderate Complexity: 1 Mod  [name], OT  Acute Rehabilitation Services Office [phone] Secure chat preferred   [name] 01/02/2024, 9:05 AM

## 2024-01-02 NOTE — Evaluation (Signed)
 Physical Therapy Brief Evaluation and Discharge Note Patient Details Name: Karina [name] MRN: [id number] DOB: 11-28-1950 Today's Date: 01/02/2024   History of Present Illness  Pt is a 73 y.o female admitted 10/29 for scheduled R fem endarterectomy. PMH: HLD, HTN, hypothyroidism  Clinical Impression  Patient is s/p above surgery resulting in functional limitations due to the deficits listed below (see PT Problem List). Previously independent but ambulatory distance limited due to LE pain. Does not use AD, denies falls, plans to have 24/7 supervision at d/c as needed. Able to transfer and ambulate at a supervision level, with and without RW. DGI indicates elevated fall risk which I think can be reduced with use of SPC for a while. Discussed with patient. May benefit from OPPT follow-up as well. All questions answered. HEP handout provided and reviewed with pt. Daughter present and supportive. Patient will benefit from acute skilled PT to increase their independence and safety with mobility to facilitate discharge.        PT Assessment Patient does not need any further PT services  Assistance Needed at Discharge  Set up Supervision/Assistance    Equipment Recommendations None recommended by PT  Recommendations for Other Services       Precautions/Restrictions Precautions Precautions: Fall Recall of Precautions/Restrictions: Intact Restrictions Weight Bearing Restrictions Per Provider Order: No        Mobility  Bed Mobility       General bed mobility comments: in recliner  Transfers Overall transfer level: Needs assistance Equipment used: None, Rolling walker (2 wheels) Transfers: Sit to/from Stand Sit to Stand: Supervision           General transfer comment: Supervision for safety performed with and withou AD. No overt LOB noted.    Ambulation/Gait Ambulation/Gait assistance: Supervision Gait Distance (Feet): 200 Feet Assistive device: Rolling walker (2  wheels), None Gait Pattern/deviations: Step-through pattern, Decreased stride length, Decreased stance time - right, Drifts right/left, Antalgic Gait Speed: Below normal General Gait Details: Mildly antalgic, with minor drift when challenged with dynamic functional tasks. Shows reduced confidence as well, reaching for rail without RW. No overt buckling or LOB noted however. Educated on findings, symptom, and safety awareness.  Home Activity Instructions Home Activity Instructions: Use SPC, gradually increase activity. Consider OPPT due to reporting minor instability at baseline and furniture surfing.  Stairs            Modified Rankin (Stroke Patients Only)        Balance Overall balance assessment: Mild deficits observed, not formally tested               Standardized Balance Assessment Standardized Balance Assessment : Dynamic Gait Index Dynamic Gait Index Level Surface: Normal Change in Gait Speed: Normal Gait with Horizontal Head Turns: Mild Impairment Gait with Vertical Head Turns: Mild Impairment Gait and Pivot Turn: Normal Step Over Obstacle: Mild Impairment Step Around Obstacles: Mild Impairment Steps: Mild Impairment Total Score: 19      Pertinent Vitals/Pain PT - Brief Vital Signs All Vital Signs Stable: Yes Pain Assessment Pain Assessment: 0-10 Pain Score: 1  Pain Location: Surgical site Pain Descriptors / Indicators: Operative site guarding Pain Intervention(s): Limited activity within patient's tolerance, Monitored during session, Repositioned     Home Living Family/patient expects to be discharged to:: Private residence Living Arrangements: Spouse/significant other Available Help at Discharge: Family;Available 24 hours/day (For a week) Home Environment: Stairs to enter  [employer] of Steps: 2 Home Equipment: Cane - single point  Prior Function Level of Independence: Independent with assistive device(s) Comments: Ambulatory  distance limited    UE/LE Assessment        LE ROM/Strength/Tone/Coordination: Generalized weakness      Communication   Communication Communication: Impaired Factors Affecting Communication: Hearing impaired     Cognition Overall Cognitive Status: Appears within functional limits for tasks assessed/performed       General Comments General comments (skin integrity, edema, etc.): Daughter present, very supportive.    Exercises General Exercises - Lower Extremity Ankle Circles/Pumps: AROM, Both, 10 reps, Seated Quad Sets: Strengthening, Both, 10 reps, Seated Gluteal Sets: Strengthening, Both, 10 reps, Seated   Assessment/Plan    PT Problem List         PT Visit Diagnosis Unsteadiness on feet (R26.81);Other abnormalities of gait and mobility (R26.89);Pain    No Skilled PT All education completed;Patient will have necessary level of assist by caregiver at discharge;Patient is supervision for all activity/mobility   Co-evaluation                AMPAC 6 Clicks Help needed turning from your back to your side while in a flat bed without using bedrails?: None Help needed moving from lying on your back to sitting on the side of a flat bed without using bedrails?: None Help needed moving to and from a bed to a chair (including a wheelchair)?: A Little Help needed standing up from a chair using your arms (e.g., wheelchair or bedside chair)?: A Little Help needed to walk in hospital room?: A Little Help needed climbing 3-5 steps with a railing? : A Little 6 Click Score: 20      End of Session Equipment Utilized During Treatment: Gait belt Activity Tolerance: Patient tolerated treatment well Patient left: in chair;with call bell/phone within reach;with family/visitor present;with nursing/sitter in room (OT present to take over) Nurse Communication: Mobility status PT Visit Diagnosis: Unsteadiness on feet (R26.81);Other abnormalities of gait and mobility  (R26.89);Pain Pain - Right/Left: Right Pain - part of body:  (groin)     Time: 9266-9246 PT Time Calculation (min) (ACUTE ONLY): 20 min  Charges:   PT Evaluation $PT Eval Low Complexity: 1 Low      Leontine Roads, PT, DPT [facility] Physical Therapist Office: [phone] Website: [email or web address]   Leontine GORMAN Roads  01/02/2024, 11:25 AM

## 2024-01-02 NOTE — Progress Notes (Incomplete)
 DISCHARGE NOTE HOME Karina [name] to be discharged Home per MD order. Discussed prescriptions and follow up appointments with the patient. Prescriptions given to patient; medication list explained in detail. Patient verbalized understanding.  Skin clean, dry and intact without evidence of skin break down, no evidence of skin tears noted. IV catheter discontinued intact. Site without signs and symptoms of complications. Dressing and pressure applied. Pt denies pain at the site currently. No complaints noted.  See Lda for surgical in cision right thigh Patient free of other lines, drains, and wounds.   An After Visit Summary (AVS) was printed and given to the patient. Patient escorted via wheelchair, and discharged home via private auto.  Peyton [name], RN

## 2024-01-02 NOTE — Discharge Summary (Signed)
 Discharge Summary  Patient ID: Karina [name] [id number] 73 y.o. 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 New on 01/01/2024 due to right leg claudication.  She tolerated the procedure well and was admitted to the hospital postoperatively.  POD #1 she was ambulating well and ready for discharge home.  The groin incision was well-appearing.  She maintained brisk DP and PT signals bilaterally.  She will follow-up in the office in 2 to 3 weeks for incision check.  She will be prescribed 1 to 2 days of narcotic pain medication for continued postoperative pain control.  She will be on aspirin  and statin daily.  She was discharged home in stable condition.  Consults: None  Treatments: surgery: Right iliofemoral endarterectomy with bovine pericardial patch angioplasty by Dr. New 01/01/24  Discharge Exam: See progress note 01/02/24 Vitals:   01/02/24 0413 01/02/24 0801  BP: (!) 121/52 136/63  Pulse: 71 92  Resp: 17 20  Temp: 98.2 F (36.8 C) 98.2 F (36.8 C)  SpO2: 95% 98%     Disposition: Discharge disposition: 01-Home or Self Care       Patient Instructions:  Allergies as of 01/02/2024       Reactions   Maxzide  [hydrochlorothiazide -triamterene ] Other (See Comments)   Severe hyponatremia with med.   Hydralazine     Pt does not know reaction         Medication List     TAKE these medications    amLODipine  10 MG tablet Commonly known as: NORVASC  Take 1  tablet (10 mg total) by mouth in the morning. What changed: when to take this   aspirin  81 MG chewable tablet Commonly known as: Aspirin  Childrens Chew 1 tablet (81 mg total) by mouth daily. What changed: when to take this   atenolol  25 MG tablet Commonly known as: TENORMIN  Take 25 mg by mouth every other day.   IRON PO Take 1 tablet by mouth every 3 (three) days.   levothyroxine  100 MCG tablet Commonly known as: SYNTHROID  Take 1 tablet (100 mcg total) by mouth daily before breakfast.   MAGNESIUM PO Take 1 capsule by mouth 3 (three) times a week.   multivitamin with minerals tablet Take 1 tablet by mouth daily.   nitrofurantoin (macrocrystal-monohydrate) 100 MG capsule Commonly known as: Macrobid Take 1 capsule (100 mg total) by mouth 2 (two) times daily for 7 days.   oxyCODONE -acetaminophen  5-325 MG tablet Commonly known as: PERCOCET/ROXICET Take 1 tablet by mouth every 6 (six) hours as needed for moderate pain (pain score 4-6).   pravastatin  20 MG tablet Commonly known as: PRAVACHOL  Take 1 tablet (20 mg total) by mouth daily. Reported on 06/18/2015 What changed: when to take this   telmisartan  20 MG tablet Commonly known as: MICARDIS  Take 1 tablet (20 mg total) by mouth every evening.   VITAMIN B-12 PO Take 1 tablet by mouth 3 (three) times a week.   VITAMIN C PO Take 1 tablet by mouth 3 (three) times a week.   VITAMIN D3 PO Take 1 tablet by mouth  3 (three) times a week.               Durable Medical Equipment  (From admission, onward)           Start     Ordered   01/02/24 0713  For home use only DME Walker rolling  Once       Comments: S/p femoral endartectomy, 01/01/2024  Question Answer Comment  Walker: With 5 Inch Wheels   Patient needs a walker to treat with the following condition History of endarterectomy      01/02/24 0712           Activity: activity as tolerated Diet: regular diet Wound Care: keep wound clean and  dry  Follow-up with [facility] in 3 weeks.  SignedBETHA Donnice Sender, PA-C 01/02/2024 11:25 AM [facility] Office: [phone]

## 2024-01-17 ENCOUNTER — Telehealth: Payer: Self-pay

## 2024-01-17 NOTE — Telephone Encounter (Signed)
 Patient called reporting 4 small dots of blood on my clothes from my incision.  No signs of infection reported.  Patient advised to keep incision clean and dry and to put clean, dry gauze in her under garment to protect the incision from her clothes rubbing it.  Patient knows to keep incision clean and dry. Patient knows to call back or go to the ED if the bleeding increases, or signs of infection develop.

## 2024-01-22 NOTE — Progress Notes (Signed)
 POST OPERATIVE OFFICE NOTE    CC:  F/u for surgery  HPI:  This is a 73 y.o. female who is s/p right iliofemoral endarterectomy with bovine pericardial patch angioplasty on 01/01/2024 by Dr. Serene for right leg claudication.    She has hx of renal artery stenting with Dr. Ladona in 2022.   She does have bilateral carotid stenosis on duplex in August 2025 with 60-79% right and 40-59% left ICA stenosis.  This is also followed by Dr. Ladona.   She was discharged on POD 1 and doing well.    Pt returns today for follow up and here with her daughter.  Pt states her right leg cramping is better and she thinks her right leg is better than her left.  She states her incision is healing but still painful for her clothes to touch it.  She has been cleaning it daily and using what sounds like saline and then using blow dryer to get it completely dry.   She is compliant with her asa and statin.  Fortunately she did quit smoking in 2016.    Allergies  Allergen Reactions   Maxzide  [Hydrochlorothiazide -Triamterene ] Other (See Comments)    Severe hyponatremia with med.   Hydralazine      Pt does not know reaction     Current Outpatient Medications  Medication Sig Dispense Refill   amLODipine  (NORVASC ) 10 MG tablet Take 1 tablet (10 mg total) by mouth in the morning. (Patient taking differently: Take 10 mg by mouth at bedtime.) 90 tablet 3   Ascorbic Acid (VITAMIN C PO) Take 1 tablet by mouth 3 (three) times a week.     aspirin  (ASPIRIN  CHILDRENS) 81 MG chewable tablet Chew 1 tablet (81 mg total) by mouth daily. (Patient taking differently: Chew 81 mg by mouth at bedtime.)     atenolol  (TENORMIN ) 25 MG tablet Take 25 mg by mouth every other day.     Cholecalciferol (VITAMIN D3 PO) Take 1 tablet by mouth 3 (three) times a week.     Cyanocobalamin (VITAMIN B-12 PO) Take 1 tablet by mouth 3 (three) times a week.     Ferrous Sulfate (IRON PO) Take 1 tablet by mouth every 3 (three) days.     levothyroxine   (SYNTHROID ) 100 MCG tablet Take 1 tablet (100 mcg total) by mouth daily before breakfast. 90 tablet 3   MAGNESIUM PO Take 1 capsule by mouth 3 (three) times a week.     Multiple Vitamins-Minerals (MULTIVITAMIN WITH MINERALS) tablet Take 1 tablet by mouth daily.     oxyCODONE -acetaminophen  (PERCOCET/ROXICET) 5-325 MG tablet Take 1 tablet by mouth every 6 (six) hours as needed for moderate pain (pain score 4-6). 20 tablet 0   pravastatin  (PRAVACHOL ) 20 MG tablet Take 1 tablet (20 mg total) by mouth daily. Reported on 06/18/2015 (Patient taking differently: Take 20 mg by mouth every evening. Reported on 06/18/2015) 90 tablet 3   telmisartan  (MICARDIS ) 20 MG tablet Take 1 tablet (20 mg total) by mouth every evening. 90 tablet 3   No current facility-administered medications for this visit.     ROS:  See HPI  Physical Exam:  Today's Vitals   01/27/24 0947  BP: (!) 148/81  Pulse: 84  Temp: 97.9 F (36.6 C)  TempSrc: Temporal  Weight: 133 lb 4.8 oz (60.5 kg)  PainSc: 4   PainLoc: Groin   Body mass index is 26.03 kg/m.;l  Incision:  right groin is healing nicely.  She does have healing ridge.  Extremities:  brisk monophasic doppler flow right DP/PT    Assessment/Plan:  This is a 73 y.o. female who is s/p:  right iliofemoral endarterectomy with bovine pericardial patch angioplasty on 01/01/2024 by Dr. Serene for right leg claudication.     -pt with brisk monophasic doppler flow right foot.   -incision is healing nicely.  Discussed continuing with current cleansing as her incision looks good.  There is no erythema or drainage.  Discussed not taking a bath until the incision is completely healed.  I did give her some gauze to place over the incision to help protect it.   Discussed the incision should flatten out over time as she currently has a healing ridge where the incision came back together.  -she will f/u in 3 months with ABI and see Dr. Serene since he was not in the office today.    -continue asa/statin.    Lucie Apt, [facility] [phone]   Clinic MD:  Gretta on call MD.

## 2024-01-27 ENCOUNTER — Ambulatory Visit: Attending: Surgery | Admitting: Physician Assistant

## 2024-01-27 ENCOUNTER — Encounter: Payer: Self-pay | Admitting: Physician Assistant

## 2024-01-27 VITALS — BP 148/81 | HR 84 | Temp 97.9°F | Wt 133.3 lb

## 2024-01-27 DIAGNOSIS — I739 Peripheral vascular disease, unspecified: Secondary | ICD-10-CM

## 2024-01-29 ENCOUNTER — Other Ambulatory Visit: Payer: Self-pay

## 2024-01-29 DIAGNOSIS — I739 Peripheral vascular disease, unspecified: Secondary | ICD-10-CM

## 2024-05-11 ENCOUNTER — Ambulatory Visit: Admitting: Surgery

## 2024-05-11 ENCOUNTER — Ambulatory Visit (HOSPITAL_COMMUNITY)
